# Patient Record
Sex: Female | Born: 1965 | Race: Black or African American | Hispanic: No | Marital: Single | Smoking: Never smoker
Health system: Southern US, Community
[De-identification: ages and names within clinical notes are randomized; demographics above are authoritative.]

## PROBLEM LIST (undated history)

## (undated) HISTORY — PX: KNEE SURGERY: SHX244

## (undated) HISTORY — PX: GALLBLADDER SURGERY: SHX652

## (undated) HISTORY — PX: TUBAL LIGATION: SHX77

---

## 2001-04-25 ENCOUNTER — Ambulatory Visit (HOSPITAL_COMMUNITY): Admission: RE | Admit: 2001-04-25 | Discharge: 2001-04-25 | Payer: Self-pay | Admitting: Chiropractic Medicine

## 2001-04-25 ENCOUNTER — Encounter: Payer: Self-pay | Admitting: Chiropractic Medicine

## 2001-11-14 ENCOUNTER — Emergency Department (HOSPITAL_COMMUNITY): Admission: EM | Admit: 2001-11-14 | Discharge: 2001-11-14 | Payer: Self-pay | Admitting: *Deleted

## 2001-12-19 ENCOUNTER — Emergency Department (HOSPITAL_COMMUNITY): Admission: EM | Admit: 2001-12-19 | Discharge: 2001-12-19 | Payer: Self-pay | Admitting: Emergency Medicine

## 2002-03-02 ENCOUNTER — Encounter: Admission: RE | Admit: 2002-03-02 | Discharge: 2002-05-31 | Payer: Self-pay | Admitting: Orthopedic Surgery

## 2002-04-01 ENCOUNTER — Encounter: Payer: Self-pay | Admitting: Orthopedic Surgery

## 2002-04-01 ENCOUNTER — Ambulatory Visit (HOSPITAL_BASED_OUTPATIENT_CLINIC_OR_DEPARTMENT_OTHER): Admission: RE | Admit: 2002-04-01 | Discharge: 2002-04-01 | Payer: Self-pay | Admitting: Orthopedic Surgery

## 2002-08-25 ENCOUNTER — Encounter: Payer: Self-pay | Admitting: Emergency Medicine

## 2002-08-25 ENCOUNTER — Emergency Department (HOSPITAL_COMMUNITY): Admission: EM | Admit: 2002-08-25 | Discharge: 2002-08-25 | Payer: Self-pay | Admitting: Emergency Medicine

## 2003-08-01 ENCOUNTER — Emergency Department (HOSPITAL_COMMUNITY): Admission: EM | Admit: 2003-08-01 | Discharge: 2003-08-01 | Payer: Self-pay | Admitting: Emergency Medicine

## 2003-11-16 ENCOUNTER — Emergency Department (HOSPITAL_COMMUNITY): Admission: EM | Admit: 2003-11-16 | Discharge: 2003-11-16 | Payer: Self-pay | Admitting: Emergency Medicine

## 2004-04-13 ENCOUNTER — Emergency Department (HOSPITAL_COMMUNITY): Admission: EM | Admit: 2004-04-13 | Discharge: 2004-04-13 | Payer: Self-pay | Admitting: Family Medicine

## 2005-05-15 ENCOUNTER — Emergency Department: Payer: Self-pay | Admitting: Emergency Medicine

## 2006-01-22 ENCOUNTER — Emergency Department: Payer: Self-pay | Admitting: Unknown Physician Specialty

## 2007-08-08 ENCOUNTER — Emergency Department: Payer: Self-pay | Admitting: Emergency Medicine

## 2008-07-09 ENCOUNTER — Emergency Department: Payer: Self-pay

## 2010-10-22 ENCOUNTER — Emergency Department (HOSPITAL_COMMUNITY): Payer: Self-pay

## 2010-10-22 ENCOUNTER — Emergency Department (HOSPITAL_COMMUNITY)
Admission: EM | Admit: 2010-10-22 | Discharge: 2010-10-23 | Disposition: A | Discharge status: Home / Self Care | Payer: Self-pay | Attending: Emergency Medicine | Admitting: Emergency Medicine

## 2010-10-22 DIAGNOSIS — R209 Unspecified disturbances of skin sensation: Secondary | ICD-10-CM | POA: Insufficient documentation

## 2010-10-22 DIAGNOSIS — S46909A Unspecified injury of unspecified muscle, fascia and tendon at shoulder and upper arm level, unspecified arm, initial encounter: Secondary | ICD-10-CM | POA: Insufficient documentation

## 2010-10-22 DIAGNOSIS — S4980XA Other specified injuries of shoulder and upper arm, unspecified arm, initial encounter: Secondary | ICD-10-CM | POA: Insufficient documentation

## 2010-10-22 DIAGNOSIS — R112 Nausea with vomiting, unspecified: Secondary | ICD-10-CM | POA: Insufficient documentation

## 2010-10-22 DIAGNOSIS — IMO0002 Reserved for concepts with insufficient information to code with codable children: Secondary | ICD-10-CM | POA: Insufficient documentation

## 2010-10-22 DIAGNOSIS — R0789 Other chest pain: Secondary | ICD-10-CM | POA: Insufficient documentation

## 2010-10-22 DIAGNOSIS — M25519 Pain in unspecified shoulder: Secondary | ICD-10-CM | POA: Insufficient documentation

## 2010-10-22 DIAGNOSIS — W108XXA Fall (on) (from) other stairs and steps, initial encounter: Secondary | ICD-10-CM | POA: Insufficient documentation

## 2011-09-02 ENCOUNTER — Emergency Department (HOSPITAL_COMMUNITY)
Admission: EM | Admit: 2011-09-02 | Discharge: 2011-09-03 | Disposition: A | Discharge status: Home / Self Care | Payer: Self-pay | Attending: Emergency Medicine | Admitting: Emergency Medicine

## 2011-09-02 ENCOUNTER — Encounter (HOSPITAL_COMMUNITY): Payer: Self-pay | Admitting: *Deleted

## 2011-09-02 DIAGNOSIS — R0789 Other chest pain: Secondary | ICD-10-CM

## 2011-09-02 DIAGNOSIS — Z9089 Acquired absence of other organs: Secondary | ICD-10-CM | POA: Insufficient documentation

## 2011-09-02 DIAGNOSIS — R071 Chest pain on breathing: Secondary | ICD-10-CM | POA: Insufficient documentation

## 2011-09-02 DIAGNOSIS — R079 Chest pain, unspecified: Secondary | ICD-10-CM | POA: Insufficient documentation

## 2011-09-02 MED ORDER — ASPIRIN 81 MG PO CHEW
324.0000 mg | CHEWABLE_TABLET | Freq: Once | ORAL | Status: AC
  Administered 2011-09-02: 324 mg via ORAL
  Filled 2011-09-02: qty 4

## 2011-09-02 NOTE — ED Notes (Signed)
IV team was called to attempt IV for Tina Acosta.

## 2011-09-02 NOTE — ED Provider Notes (Signed)
History     CSN: [id number]  Arrival date & time 09/02/11  2238   First MD Initiated Contact with Patient 09/02/11 2306      Chief Complaint  Patient presents with  . Chest Pain    (Consider location/radiation/quality/duration/timing/severity/associated sxs/prior treatment) HPI Patient complaining of right side chest pain for 7 days.  Pain comes and goes, it is sharp and radiates from right flank under right breast to parasternal area.  Pain does not increase except with movemnent of arms.  Pain has increased tonight.  She has associated dyspnea with pain with deep breathing.  There is no neck pain, back pain, fever, cough, nausea, vomiting, leg swelling, or history of dvt.  Patient does not smoke and is not on hormones. S/P cholecystectomy.  History reviewed. No pertinent past medical history.  History reviewed. No pertinent past surgical history.  No family history on file.  History  Substance Use Topics  . Smoking status: Never Smoker   . Smokeless tobacco: Not on file  . Alcohol Use: No    OB History    Grav Para Term Preterm Abortions TAB SAB Ect Mult Living                  Review of Systems  All other systems reviewed and are negative.    Allergies  Review of patient's allergies indicates no known allergies.  Home Medications   Current Outpatient Rx  Name Route Sig Dispense Refill  . ASPIRIN EC 81 MG PO TBEC Oral Take 81 mg by mouth once.    . IBUPROFEN 200 MG PO TABS Oral Take 400 mg by mouth every 6 (six) hours as needed. For pain      BP 113/68  Pulse 76  Temp(Src) 98.2 F (36.8 C) (Oral)  Resp 18  SpO2 98%  LMP 08/02/2011  Physical Exam  Nursing note and vitals reviewed. Constitutional: She appears well-developed and well-nourished.       Morbidly obese  HENT:  Head: Normocephalic.  Eyes: Conjunctivae are normal. Pupils are equal, round, and reactive to light.  Neck: Normal range of motion. Neck supple.  Pulmonary/Chest: Effort normal.          Tenderness to palpation under right breast    ED Course  Procedures (including critical care time)   Labs Reviewed  CBC  DIFFERENTIAL  COMPREHENSIVE METABOLIC PANEL  LIPASE, BLOOD  TROPONIN I  URINALYSIS, ROUTINE W REFLEX MICROSCOPIC  PREGNANCY, URINE  CK TOTAL AND CKMB   No results found.   No diagnosis found.   Date: 09/03/2011  Rate: 83  Rhythm: normal sinus rhythm  QRS Axis: normal  Intervals: normal  ST/T Wave abnormalities: nonspecific t wave changes v3  Conduction Disutrbances: none  Narrative Interpretation: unremarkable     MDM  Pain consistent with chest wall pain. Initial CT angiography and was nondiagnostic due to poor contrast. She has had difficult IV access. Nursing was unable to obtain anything larger than a 22 which is in her right upper arm. 20 in her left antecubital was blown during the injection of contrast. Secondary to the above the IV access was obtained by me. She had a 20-gauge catheter placed in her left forearm under sterile conditions. She good blood draw and flushing this was subsequently used obtained her CT angiogram.        Hilario Quarry, MD 09/03/11 [phone]

## 2011-09-02 NOTE — ED Notes (Signed)
The pt has had rt upper quadrant or rt lower chest pain since Monday intermittently no nv.  Her gb was removed  When she was 16.  She has taken tums without relief

## 2011-09-03 ENCOUNTER — Emergency Department (HOSPITAL_COMMUNITY): Payer: Self-pay

## 2011-09-03 ENCOUNTER — Encounter (HOSPITAL_COMMUNITY): Payer: Self-pay | Admitting: Radiology

## 2011-09-03 LAB — DIFFERENTIAL
Basophils Absolute: 0.1 10*3/uL (ref 0.0–0.1)
Basophils Relative: 1 % (ref 0–1)
Eosinophils Absolute: 0.3 10*3/uL (ref 0.0–0.7)
Eosinophils Relative: 4 % (ref 0–5)
Lymphocytes Relative: 43 % (ref 12–46)
Lymphs Abs: 3.7 10*3/uL (ref 0.7–4.0)
Monocytes Absolute: 0.7 10*3/uL (ref 0.1–1.0)
Monocytes Relative: 8 % (ref 3–12)
Neutro Abs: 3.8 10*3/uL (ref 1.7–7.7)
Neutrophils Relative %: 44 % (ref 43–77)

## 2011-09-03 LAB — COMPREHENSIVE METABOLIC PANEL
ALT: 16 U/L (ref 0–35)
AST: 21 U/L (ref 0–37)
Albumin: 3.8 g/dL (ref 3.5–5.2)
Alkaline Phosphatase: 98 U/L (ref 39–117)
BUN: 21 mg/dL (ref 6–23)
CO2: 23 mEq/L (ref 19–32)
Calcium: 9.1 mg/dL (ref 8.4–10.5)
Chloride: 99 mEq/L (ref 96–112)
Creatinine, Ser: 0.66 mg/dL (ref 0.50–1.10)
GFR calc Af Amer: >90 mL/min (ref >90)
GFR calc non Af Amer: >90 mL/min (ref >90)
Glucose, Bld: 104 mg/dL — ABNORMAL HIGH (ref 70–99)
Potassium: 4.2 mEq/L (ref 3.5–5.1)
Sodium: 135 mEq/L (ref 135–145)
Total Bilirubin: 0.4 mg/dL (ref 0.3–1.2)
Total Protein: 8 g/dL (ref 6.0–8.3)

## 2011-09-03 LAB — URINALYSIS, ROUTINE W REFLEX MICROSCOPIC
Glucose, UA: NEGATIVE mg/dL
Ketones, ur: NEGATIVE mg/dL
Leukocytes, UA: NEGATIVE
Nitrite: NEGATIVE
Specific Gravity, Urine: 1.034 — ABNORMAL HIGH (ref 1.005–1.030)
pH: 6 (ref 5.0–8.0)

## 2011-09-03 LAB — CBC
HCT: 35 % — ABNORMAL LOW (ref 36.0–46.0)
MCHC: 32.6 g/dL (ref 30.0–36.0)
MCV: 82.4 fL (ref 78.0–100.0)
Platelets: 282 10*3/uL (ref 150–400)
RDW: 15 % (ref 11.5–15.5)
WBC: 8.6 10*3/uL (ref 4.0–10.5)

## 2011-09-03 LAB — CK TOTAL AND CKMB (NOT AT ARMC)
CK, MB: 1.3 ng/mL (ref 0.3–4.0)
Relative Index: INVALID (ref 0.0–2.5)
Total CK: 78 U/L (ref 7–177)

## 2011-09-03 LAB — PREGNANCY, URINE: Preg Test, Ur: NEGATIVE

## 2011-09-03 LAB — TROPONIN I: Troponin I: <0.3 ng/mL (ref <0.30)

## 2011-09-03 MED ORDER — OXYCODONE-ACETAMINOPHEN 5-325 MG PO TABS
2.0000 | ORAL_TABLET | Freq: Once | ORAL | Status: AC
  Administered 2011-09-03: 2 via ORAL
  Filled 2011-09-03: qty 2

## 2011-09-03 MED ORDER — KETOROLAC TROMETHAMINE 30 MG/ML IJ SOLN
30.0000 mg | Freq: Once | INTRAMUSCULAR | Status: AC
  Administered 2011-09-03: 30 mg via INTRAVENOUS
  Filled 2011-09-03: qty 1

## 2011-09-03 MED ORDER — OXYCODONE-ACETAMINOPHEN 5-325 MG PO TABS: 1.0000 | ORAL_TABLET | ORAL | Status: AC | PRN

## 2011-09-03 MED ORDER — IOHEXOL 350 MG/ML SOLN
75.0000 mL | Freq: Once | INTRAVENOUS | Status: AC | PRN
  Administered 2011-09-03: 75 mL via INTRAVENOUS

## 2011-09-03 MED ORDER — IOHEXOL 350 MG/ML SOLN
100.0000 mL | Freq: Once | INTRAVENOUS | Status: AC | PRN
  Administered 2011-09-03: 100 mL via INTRAVENOUS

## 2011-09-03 NOTE — ED Notes (Signed)
PT ambulated with a steady gait; VSS; A&Ox3; no signs of distress; respirations even and unlabored' skin warm and dry. No questions at this time.  

## 2011-09-03 NOTE — Discharge Instructions (Signed)
Chest Wall Pain Chest wall pain is pain in or around the bones and muscles of your chest. It may take up to 6 weeks to get better. It may take longer if you must stay physically active in your work and activities.  CAUSES  Chest wall pain may happen on its own. However, it may be caused by:  A viral illness like the flu.   Injury.   Coughing.   Exercise.   Arthritis.   Fibromyalgia.   Shingles.  HOME CARE INSTRUCTIONS   Avoid overtiring physical activity. Try not to strain or perform activities that cause pain. This includes any activities using your chest or your abdominal and side muscles, especially if heavy weights are used.   Put ice on the sore area.   Put ice in a plastic bag.   Place a towel between your skin and the bag.   Leave the ice on for 15 to 20 minutes per hour while awake for the first 2 days.   Only take over-the-counter or prescription medicines for pain, discomfort, or fever as directed by your caregiver.  SEEK IMMEDIATE MEDICAL CARE IF:   Your pain increases, or you are very uncomfortable.   You have a fever.   Your chest pain becomes worse.   You have new, unexplained symptoms.   You have nausea or vomiting.   You feel sweaty or lightheaded.   You have a cough with phlegm (sputum), or you cough up blood.  MAKE SURE YOU:   Understand these instructions.   Will watch your condition.   Will get help right away if you are not doing well or get worse.  Document Released: 03/12/2005 Document Revised: 03/01/2011 Document Reviewed: 11/06/2010 [employer] Patient Information 2012 [employer]. 

## 2011-09-03 NOTE — ED Notes (Signed)
Patient transported to CT 

## 2011-09-03 NOTE — ED Notes (Signed)
IV team at bedside 

## 2012-06-19 ENCOUNTER — Emergency Department (HOSPITAL_COMMUNITY)
Admission: EM | Admit: 2012-06-19 | Discharge: 2012-06-20 | Disposition: A | Discharge status: Home / Self Care | Payer: Self-pay | Attending: Emergency Medicine | Admitting: Emergency Medicine

## 2012-06-19 ENCOUNTER — Encounter (HOSPITAL_COMMUNITY): Payer: Self-pay | Admitting: *Deleted

## 2012-06-19 DIAGNOSIS — Z3202 Encounter for pregnancy test, result negative: Secondary | ICD-10-CM | POA: Insufficient documentation

## 2012-06-19 DIAGNOSIS — R202 Paresthesia of skin: Secondary | ICD-10-CM

## 2012-06-19 DIAGNOSIS — M549 Dorsalgia, unspecified: Secondary | ICD-10-CM

## 2012-06-19 DIAGNOSIS — IMO0002 Reserved for concepts with insufficient information to code with codable children: Secondary | ICD-10-CM | POA: Insufficient documentation

## 2012-06-19 DIAGNOSIS — Z7982 Long term (current) use of aspirin: Secondary | ICD-10-CM | POA: Insufficient documentation

## 2012-06-19 DIAGNOSIS — Y9389 Activity, other specified: Secondary | ICD-10-CM | POA: Insufficient documentation

## 2012-06-19 DIAGNOSIS — X500XXA Overexertion from strenuous movement or load, initial encounter: Secondary | ICD-10-CM | POA: Insufficient documentation

## 2012-06-19 DIAGNOSIS — R209 Unspecified disturbances of skin sensation: Secondary | ICD-10-CM | POA: Insufficient documentation

## 2012-06-19 DIAGNOSIS — Y929 Unspecified place or not applicable: Secondary | ICD-10-CM | POA: Insufficient documentation

## 2012-06-19 NOTE — ED Notes (Signed)
Pt c/o lower back x 3 days, c/o urinary frequency.  Also c/o arm pain when waking in the morning.  C/o weight gain over past month.

## 2012-06-20 ENCOUNTER — Encounter (HOSPITAL_COMMUNITY): Payer: Self-pay | Admitting: Radiology

## 2012-06-20 ENCOUNTER — Emergency Department (HOSPITAL_COMMUNITY): Payer: Self-pay

## 2012-06-20 LAB — URINALYSIS, ROUTINE W REFLEX MICROSCOPIC
Bilirubin Urine: NEGATIVE
Hgb urine dipstick: NEGATIVE
Ketones, ur: NEGATIVE mg/dL
Nitrite: NEGATIVE
Specific Gravity, Urine: 1.026 (ref 1.005–1.030)
Urobilinogen, UA: 0.2 mg/dL (ref 0.0–1.0)

## 2012-06-20 MED ORDER — IBUPROFEN 800 MG PO TABS: 800.0000 mg | ORAL_TABLET | Freq: Three times a day (TID) | ORAL | Status: DC

## 2012-06-20 MED ORDER — HYDROCODONE-ACETAMINOPHEN 5-325 MG PO TABS: 1.0000 | ORAL_TABLET | ORAL | Status: DC | PRN

## 2012-06-20 NOTE — ED Notes (Signed)
PA at bedside.

## 2012-06-20 NOTE — ED Provider Notes (Signed)
History     CSN: [id number]  Arrival date & time 06/19/12  2254   First MD Initiated Contact with Patient 06/19/12 2358      Chief Complaint  Patient presents with  . Back Pain    (Consider location/radiation/quality/duration/timing/severity/associated sxs/prior treatment) HPI History provided by pt.   Pt c/o 2-3 days constant, mid-line low back pain, with radiation up back to neck.  Aggravated by movement.  Simultaneous onset sharp pain flexor surface right elbow that is aggravated by ROM and associated w/ intermittent paresthesias and limited active ROM.  It feels as though her arm is asleep.  Occurs when she wakes in am and when she attempts to lift something.  Denies headache, dizziness, vision changes, dysarthria, dysphagia, weakness/paresthesias RUE and LEs as well as fever and bladder/bowel dysfunction.  Denies trauma.   No prior h/o same.  No PMH.  History reviewed. No pertinent past medical history.  Past Surgical History  Procedure Laterality Date  . Gallbladder surgery    . Tubal ligation      History reviewed. No pertinent family history.  History  Substance Use Topics  . Smoking status: Never Smoker   . Smokeless tobacco: Not on file  . Alcohol Use: No    OB History   Grav Para Term Preterm Abortions TAB SAB Ect Mult Living                  Review of Systems  All other systems reviewed and are negative.    Allergies  Review of patient's allergies indicates no known allergies.  Home Medications   Current Outpatient Rx  Name  Route  Sig  Dispense  Refill  . aspirin EC 81 MG tablet   Oral   Take 81 mg by mouth once.         Marland Kitchen ibuprofen (ADVIL,MOTRIN) 200 MG tablet   Oral   Take 400 mg by mouth every 6 (six) hours as needed. For pain           BP 127/76  Pulse 92  Temp(Src) 98 F (36.7 C) (Oral)  Resp 22  Ht 5\' 7"  (1.702 m)  Wt 347 lb (157.398 kg)  BMI 54.34 kg/m2  SpO2 100%  LMP 06/16/2012  Physical Exam  Nursing note and vitals  reviewed. Constitutional: She is oriented to person, place, and time. She appears well-developed and well-nourished. No distress.  Morbidly obese  HENT:  Head: Normocephalic and atraumatic.  Eyes:  Normal appearance  Neck: Normal range of motion.  Cardiovascular: Normal rate, regular rhythm and intact distal pulses.   Pulmonary/Chest: Effort normal and breath sounds normal.  Musculoskeletal: Normal range of motion.  Mid-line lower cervical as well as lumbar spinal ttp.  Tenderness left antecubital fossa.  No overlying skin changes or edema.  Pain w/ passive flexion.    Neurological: She is alert and oriented to person, place, and time. No sensory deficit. Coordination normal.  CN 3-12 intact.  No nystagmus. 5/5 and equal lower extremity strength.  Decreased left elbow flexion/extension strength compared to right.  No past pointing.     Skin: Skin is warm and dry. No rash noted.  Psychiatric: She has a normal mood and affect. Her behavior is normal.    ED Course  Procedures (including critical care time)  Labs Reviewed  URINALYSIS, ROUTINE W REFLEX MICROSCOPIC - Abnormal; Notable for the following:    APPearance HAZY (*)    All other components within normal limits  POCT PREGNANCY, URINE  Dg Lumbar Spine Complete  06/20/2012  *RADIOLOGY REPORT*  Clinical Data: No injury.  Progressive back pain.  LUMBAR SPINE - COMPLETE 4+ VIEW  Comparison: None.  Findings: Mild dextroconvex curvature of the lumbar spine.  Lower lumbar facet arthrosis.  No pars defects.  L3-L4 predominant degenerative disc disease with loss of disc height and small marginal spurs.  There is no spondylolisthesis.  L4-L5 and L5-S1 facet arthrosis.  Vertebral body height is preserved.  Lumbosacral junction appears normal.  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  IMPRESSION: Mild lumbar spondylosis and facet arthrosis without acute osseous abnormality.  L3-L4 predominant degenerative disc disease.  Dextroconvex curve may be positional or secondary spasm.   Original Report Authenticated By: Andreas Newport, M.D.    Ct Head Wo Contrast  06/20/2012  *RADIOLOGY REPORT*  Clinical Data:  Parasthesias.  Intermittent paralysis of the left arm.  Sensory disturbance.  CT HEAD WITHOUT CONTRAST CT CERVICAL SPINE WITHOUT CONTRAST  Technique:  Multidetector CT imaging of the head and cervical spine was performed following the standard protocol without intravenous contrast.  Multiplanar CT image reconstructions of the cervical spine were also generated.  Comparison:   None  CT HEAD  Findings: No mass lesion, mass effect, midline shift, hydrocephalus, hemorrhage.  No territorial ischemia or acute infarction.  Paranasal sinuses appear within normal limits.  IMPRESSION: Negative CT head.  CT CERVICAL SPINE  Findings: Reversal of the normal cervical lordosis.  No cervical spine fracture dislocation.  The study is technically degraded by body habitus.  No bony central stenosis is identified. Craniocervical alignment is normal.  Odontoid intact.  Mandibular condyles located.  Occipital condyles appear normal.  Lung apices appear within normal limits.  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  Left cervical rib is rudimentary.  Short pedicles are present in the cervical spine.  There is foraminal encroachment at C2-C3 and C3-C4 potentially affecting the C3 and C4 nerves in this patient with left upper extremity symptoms.  IMPRESSION: No acute osseous abnormality.  Loss of normal cervical lordosis is probably positional.  Foraminal encroachment that could potentially affects the left C3 and C4 nerves.   Original Report Authenticated By: Andreas Newport, M.D.    Ct Cervical Spine Wo Contrast  06/20/2012  *RADIOLOGY REPORT*  Clinical Data:  Parasthesias.  Intermittent paralysis of the left arm.  Sensory disturbance.  CT HEAD  WITHOUT CONTRAST CT CERVICAL SPINE WITHOUT CONTRAST  Technique:  Multidetector CT imaging of the head and cervical spine was performed following the standard protocol without intravenous contrast.  Multiplanar CT image reconstructions of the cervical spine were also generated.  Comparison:   None  CT HEAD  Findings: No mass lesion, mass effect, midline shift, hydrocephalus, hemorrhage.  No territorial ischemia or acute infarction.  Paranasal sinuses appear within normal limits.  IMPRESSION: Negative CT head.  CT CERVICAL SPINE  Findings: Reversal of the normal cervical lordosis.  No cervical spine fracture dislocation.  The study is technically degraded by body habitus.  No bony central stenosis is identified. Craniocervical alignment is normal.  Odontoid intact.  Mandibular condyles located.  Occipital condyles appear normal.  Lung apices appear within normal limits.  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  Left cervical rib is rudimentary.  Short pedicles are present in the cervical spine.  There is foraminal encroachment at C2-C3 and C3-C4 potentially affecting the C3 and C4 nerves in this patient with left upper extremity symptoms.  IMPRESSION: No acute osseous abnormality.  Loss of normal cervical lordosis is probably positional.  Foraminal encroachment that could potentially affects the left C3 and C4 nerves.   Original Report Authenticated By: Andreas Newport, M.D.      1. Back pain   2. Paresthesias       MDM  Morbidly obese but otherwise healthy 47yo F presents w/ non-traumatic low back pain x 2-3 days and simultaneous onset pain, paresthesias and weakness of LUE that occurs upon waking, as well as when she attempts to lift something.  On exam, afebrile, NAD, tenderness lower cervical as well as lumbar spine, no focal neuro deficits w/ exception of decreased left elbow flexion/extension  strength, tenderness L antecubital fossa and painful passive ROM but no skin changes.  CT head and cervical spine as well as xray lumbar spine obtained at Dr. Jacolyn Reedy recommendation and are unremarkable w/ exception of mild lumbar spondylosis/DJD.  Results discussed w/ pt.  Will treat symptomatically for peripheral neuropathy or nerve entrapment of LUE as well as low back pain with sling (provided by ortho tech), vicodin, ibuprofen and ice and referral to ortho for persistent sx.  Return precautions discussed. 4:32 AM         Otilio Miu, PA-C 06/20/12 [phone]

## 2012-08-31 NOTE — ED Provider Notes (Signed)
Medical screening examination/treatment/procedure(s) were performed by non-physician practitioner and as supervising physician Dr. Verl Bangs (not available to sign chart himself) was immediately available for consultation/collaboration. Wayland Salinas, MD Medical Director  Hurman Horn, MD 08/31/12 [phone]

## 2012-12-12 ENCOUNTER — Telehealth: Payer: Self-pay

## 2012-12-12 ENCOUNTER — Ambulatory Visit: Payer: No Typology Code available for payment source | Attending: Internal Medicine | Admitting: Internal Medicine

## 2012-12-12 VITALS — BP 151/92 | HR 75 | Temp 97.9°F | Resp 15 | Wt 341.0 lb

## 2012-12-12 DIAGNOSIS — Z23 Encounter for immunization: Secondary | ICD-10-CM

## 2012-12-12 DIAGNOSIS — K0889 Other specified disorders of teeth and supporting structures: Secondary | ICD-10-CM

## 2012-12-12 DIAGNOSIS — L708 Other acne: Secondary | ICD-10-CM

## 2012-12-12 DIAGNOSIS — Z0000 Encounter for general adult medical examination without abnormal findings: Secondary | ICD-10-CM

## 2012-12-12 DIAGNOSIS — K089 Disorder of teeth and supporting structures, unspecified: Secondary | ICD-10-CM | POA: Insufficient documentation

## 2012-12-12 DIAGNOSIS — R635 Abnormal weight gain: Secondary | ICD-10-CM | POA: Insufficient documentation

## 2012-12-12 DIAGNOSIS — L709 Acne, unspecified: Secondary | ICD-10-CM | POA: Insufficient documentation

## 2012-12-12 DIAGNOSIS — R030 Elevated blood-pressure reading, without diagnosis of hypertension: Secondary | ICD-10-CM

## 2012-12-12 LAB — COMPREHENSIVE METABOLIC PANEL
BUN: 17 mg/dL (ref 6–23)
CO2: 28 mEq/L (ref 19–32)
Calcium: 9.2 mg/dL (ref 8.4–10.5)
Chloride: 103 mEq/L (ref 96–112)
Creat: 0.69 mg/dL (ref 0.50–1.10)
Total Bilirubin: 0.8 mg/dL (ref 0.3–1.2)

## 2012-12-12 LAB — CBC WITH DIFFERENTIAL/PLATELET
Eosinophils Absolute: 0.2 10*3/uL (ref 0.0–0.7)
Eosinophils Relative: 3 % (ref 0–5)
HCT: 36.3 % (ref 36.0–46.0)
Lymphocytes Relative: 37 % (ref 12–46)
Lymphs Abs: 2.7 10*3/uL (ref 0.7–4.0)
MCH: 27.3 pg (ref 26.0–34.0)
MCV: 80.7 fL (ref 78.0–100.0)
Monocytes Absolute: 0.8 10*3/uL (ref 0.1–1.0)
RBC: 4.5 MIL/uL (ref 3.87–5.11)
RDW: 14.7 % (ref 11.5–15.5)
WBC: 7.3 10*3/uL (ref 4.0–10.5)

## 2012-12-12 LAB — LIPID PANEL
Cholesterol: 251 mg/dL — ABNORMAL HIGH (ref 0–200)
HDL: 50 mg/dL (ref >39)
Total CHOL/HDL Ratio: 5 Ratio
Triglycerides: 139 mg/dL (ref <150)
VLDL: 28 mg/dL (ref 0–40)

## 2012-12-12 LAB — TSH: TSH: 2.251 u[IU]/mL (ref 0.350–4.500)

## 2012-12-12 NOTE — Progress Notes (Signed)
Patient ID: [name], female   DOB: 10-29-1965, 47 y.o.   MRN: [id number] Patient Demographics  Tina Acosta, is a 47 y.o. female  WUJ:[id number]  NFA:[id number]  DOB - 01-01-1966  Chief Complaint  Patient presents with  . Weight Gain  . Knee Pain        Subjective:   Tina Acosta today is here to establish primary care. The patient came in with several complaints and requests, has not been following any PCP. 1. states has gained more than 100lbs in 1 year, does not know why, has not been eating excessively. 2. wants a dental referral as left first premolar, lower came out 3. wants dermatology referral for acne 4. wants Pap smear, mammogram and whole body physical Patient was advised that the above complaints/requests will be addressed this visit Patient has No headache, No chest pain, No abdominal pain - No Nausea, No new weakness tingling or numbness, No Cough - SOB.   Objective:    Filed Vitals:   12/12/12 1001  BP: 151/92  Pulse: 75  Temp: 97.9 F (36.6 C)  Resp: 15  Weight: 341 lb (154.677 kg)  SpO2: 100%     ALLERGIES:  No Known Allergies  PAST MEDICAL HISTORY: History reviewed. No pertinent past medical history.  PAST SURGICAL HISTORY: Past Surgical History  Procedure Laterality Date  . Gallbladder surgery    . Tubal ligation      FAMILY HISTORY: History reviewed. No pertinent family history.  MEDICATIONS AT HOME: Prior to Admission medications   Medication Sig Start Date End Date Taking? Authorizing Provider  aspirin EC 81 MG tablet Take 81 mg by mouth once.    Historical Provider, MD  HYDROcodone-acetaminophen (NORCO/VICODIN) 5-325 MG per tablet Take 1 tablet by mouth every 4 (four) hours as needed for pain. 06/20/12   Arie Sabina Schinlever, PA-C  ibuprofen (ADVIL,MOTRIN) 200 MG tablet Take 400 mg by mouth every 6 (six) hours as needed. For pain    Historical Provider, MD  ibuprofen (ADVIL,MOTRIN) 800 MG tablet Take 1 tablet (800 mg total)  by mouth 3 (three) times daily. 06/20/12   Arie Sabina Schinlever, PA-C    REVIEW OF SYSTEMS:  Constitutional:   No   Fevers, chills, fatigue.  HEENT:    No headaches, Sore throat,   Cardio-vascular: No chest pain,  Orthopnea, swelling in lower extremities, anasarca, palpitations  GI:  No abdominal pain, nausea, vomiting, diarrhea  Resp: No shortness of breath,  No coughing up of blood.No cough.No wheezing.  Skin:  no rash or lesions.  GU:  no dysuria, change in color of urine, no urgency or frequency.  No flank pain.  Musculoskeletal: No joint pain or swelling.  No decreased range of motion.  No back pain.  Psych: No change in mood or affect. No depression or anxiety.  No memory loss.   Exam  General appearance :Awake, alert, NAD, Speech Clear. HEENT: Atraumatic and Normocephalic, PERLA Neck: supple, no JVD. No cervical lymphadenopathy.  Chest: clear to auscultation bilaterally, no wheezing, rales or rhonchi CVS: S1 S2 regular, no murmurs.  Abdomen: Obese soft, NBS, NT, ND, no gaurding, rigidity or rebound. Extremities: No cyanosis, clubbing, B/L Lower Ext shows no edema,  Neurology: Awake alert, and oriented X 3, CN II-XII intact, Non focal Skin:No Rash or lesions Wounds: N/A    Data Review   Basic Metabolic Panel: No results found for this basename: NA, K, CL, CO2, GLUCOSE, BUN, CREATININE, CALCIUM, MG, PHOS,  in the last  168 hours Liver Function Tests: No results found for this basename: AST, ALT, ALKPHOS, BILITOT, PROT, ALBUMIN,  in the last 168 hours  CBC: No results found for this basename: WBC, NEUTROABS, HGB, HCT, MCV, PLT,  in the last 168 hours ------------------------------------------------------------------------------------------------------------------ No results found for this basename: HGBA1C,  in the last 72 hours ------------------------------------------------------------------------------------------------------------------ No results  found for this basename: CHOL, HDL, LDLCALC, TRIG, CHOLHDL, LDLDIRECT,  in the last 72 hours ------------------------------------------------------------------------------------------------------------------ No results found for this basename: TSH, T4TOTAL, FREET3, T3FREE, THYROIDAB,  in the last 72 hours ------------------------------------------------------------------------------------------------------------------ No results found for this basename: VITAMINB12, FOLATE, FERRITIN, TIBC, IRON, RETICCTPCT,  in the last 72 hours  Coagulation profile  No results found for this basename: INR, PROTIME,  in the last 168 hours    Assessment & Plan   Active Problems: Weight gain: Patient states gained more than 100 pounds in 1 year  -Will check TSH, UA, CMET, CBC, does not appear to be pitting edema, no symptoms of acute heart failure.  Patient states that she does feel somewhat short of breath after walking a block but also her knees started hurting.  - Advised patient for diet and weight control     odontalgia  - Dental referral sent  Acne - Dermatology referral sent, advised patient to use cleansers and not apply thick creams  Elevated BP: Patient states no history of hypertension, BP currently 151/92 - Will recheck next time, if she does have another reading of elevated BP, will need antihypertensive   health screening  - flu shot today  - Ambulatory referral to OB/GYN for Pap smear  - Mammogram ordered   Recommendations: followup on labs   Follow-up in  3 weeks    Nthony Lefferts M.D. 12/12/2012, 10:36 AM

## 2012-12-12 NOTE — Progress Notes (Signed)
Patient here for knee pain Has been gaining weight but does not eat much Has skin issue on her face Would like referral to eye dr Referral to dentist and dermatology Would like to have a mamogram as well

## 2012-12-13 LAB — URINALYSIS
Bilirubin Urine: NEGATIVE
Glucose, UA: NEGATIVE mg/dL
Ketones, ur: NEGATIVE mg/dL
Protein, ur: NEGATIVE mg/dL
Urobilinogen, UA: 0.2 mg/dL (ref 0.0–1.0)

## 2012-12-22 ENCOUNTER — Ambulatory Visit: Payer: No Typology Code available for payment source

## 2012-12-25 ENCOUNTER — Emergency Department (HOSPITAL_COMMUNITY)
Admission: EM | Admit: 2012-12-25 | Discharge: 2012-12-25 | Disposition: A | Discharge status: Home / Self Care | Payer: No Typology Code available for payment source | Attending: Emergency Medicine | Admitting: Emergency Medicine

## 2012-12-25 ENCOUNTER — Emergency Department (HOSPITAL_COMMUNITY): Payer: No Typology Code available for payment source

## 2012-12-25 ENCOUNTER — Encounter (HOSPITAL_COMMUNITY): Payer: Self-pay | Admitting: Vascular Surgery

## 2012-12-25 DIAGNOSIS — K5900 Constipation, unspecified: Secondary | ICD-10-CM | POA: Insufficient documentation

## 2012-12-25 DIAGNOSIS — R1110 Vomiting, unspecified: Secondary | ICD-10-CM

## 2012-12-25 DIAGNOSIS — R112 Nausea with vomiting, unspecified: Secondary | ICD-10-CM | POA: Insufficient documentation

## 2012-12-25 DIAGNOSIS — Z79899 Other long term (current) drug therapy: Secondary | ICD-10-CM | POA: Insufficient documentation

## 2012-12-25 MED ORDER — BISACODYL 5 MG PO TBEC: 5.0000 mg | DELAYED_RELEASE_TABLET | Freq: Two times a day (BID) | ORAL | Status: DC

## 2012-12-25 MED ORDER — ONDANSETRON HCL 4 MG PO TABS: 4.0000 mg | ORAL_TABLET | Freq: Four times a day (QID) | ORAL | Status: DC

## 2012-12-25 MED ORDER — OXYCODONE-ACETAMINOPHEN 5-325 MG PO TABS
1.0000 | ORAL_TABLET | Freq: Once | ORAL | Status: AC
  Administered 2012-12-25: 1 via ORAL
  Filled 2012-12-25: qty 1

## 2012-12-25 MED ORDER — PEG 3350-KCL-NABCB-NACL-NASULF 236 G PO SOLR: 2.0000 L | Freq: Once | ORAL | Status: DC

## 2012-12-25 MED ORDER — BISACODYL 10 MG/30ML RE ENEM: 10.0000 mg | ENEMA | Freq: Once | RECTAL | Status: DC

## 2012-12-25 NOTE — Discharge Planning (Signed)
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 Patient was asked if she tried to get an appointment with her PCP before coming to the ED, patient states she did not try. Patient was educated about her orange card and was informed to always attempt to get an appointment at PCP for non-urgent matters. Patient states she does have an appointment with PCP on 01/08/13.

## 2012-12-25 NOTE — ED Notes (Signed)
Pt reports to the ED for eval of constipation. Pt reports that she has not been able to have a BM x 2 weeks. Pt reports that she feels a large amount of stool in her rectum but is unable to get it out. Pt reports that anytime she drinks or eats anything she either vomits or has green water come from her rectum. Pt denies any blood or coffee ground emesis. Pt also denies any fevers, chills, vaginal d/c, or bleeding. Pt has had hx of open cholecystectomy. Pt reports she has tried enemas and other OTC medications with no relief. Pt A&O x4. Abdomen tender to palpation and taut.

## 2012-12-25 NOTE — ED Provider Notes (Addendum)
I saw and evaluated the patient, reviewed the resident's note and I agree with the findings and plan.  This is a 47 year old female who presents with constipation. Patient normally has bowel movements every 2-3 days. She's been unable to have a bowel movement for the last 2 weeks.  Patient states that she's used enemas, mag citrate without relief. She's been able to tolerate by mouth but states that she has had some vomiting has been nonbilious, nonbloody. She denies any abdominal pain but does state she feels uncomfortable.  She denies any fevers, chills, vaginal complaint, dysuria.  Patient states that she strains to have a bowel movement and has some watery loose stool, now. She feels lightheaded if she strains too hard.  Patient is nontoxic-appearing on exam and vital signs are within normal limits. Abdomen is soft and nontender.  KUB is without evidence of fluid levels. Patient has had prior abdominal surgery which would put her at risk for small bowel structure; however, have low suspicion for this given her abdominal exam and negative KUB. Patient will be discharged home with GoLYTELY, stool softeners, and Dulcolax. She does have a primary care provider and was encouraged to follow-up for repeat abdominal exam in 1 day if continued symptoms.  Patient was encouraged to return if she has onset of bilious emesis.  After history, exam, and medical workup I feel the patient has been appropriately medically screened and is safe for discharge home. Pertinent diagnoses were discussed with the patient. Patient was given return precautions.   Shon Baton, MD 12/26/12 0114  Patient was able to by mouth challenge prior to discharge.  Shon Baton, MD 12/26/12 0157

## 2012-12-25 NOTE — ED Provider Notes (Signed)
CSN: [id number]     Arrival date & time 12/25/12  1528 History   First MD Initiated Contact with Patient 12/25/12 1529     Chief Complaint  Patient presents with  . Constipation   (Consider location/radiation/quality/duration/timing/severity/associated sxs/prior Treatment) Patient is a 47 y.o. female presenting with constipation.  Constipation Severity:  Moderate Time since last bowel movement:  2 weeks Timing:  Constant Progression:  Worsening Chronicity:  New Context: not narcotics   Stool description:  Watery and small Relieved by:  Nothing Worsened by:  Nothing tried Ineffective treatments:  Enemas, stool softeners and laxatives Associated symptoms: nausea and vomiting   Associated symptoms: no abdominal pain, no back pain, no diarrhea, no dysuria, no fever, no flatus and no urinary retention     History reviewed. No pertinent past medical history. Past Surgical History  Procedure Laterality Date  . Gallbladder surgery    . Tubal ligation     History reviewed. No pertinent family history. History  Substance Use Topics  . Smoking status: Never Smoker   . Smokeless tobacco: Not on file  . Alcohol Use: No   OB History   Grav Para Term Preterm Abortions TAB SAB Ect Mult Living                 Review of Systems  Constitutional: Negative for fever and chills.  HENT: Negative for congestion, sore throat and rhinorrhea.   Eyes: Negative for photophobia and visual disturbance.  Respiratory: Negative for cough and shortness of breath.   Cardiovascular: Negative for chest pain and leg swelling.  Gastrointestinal: Positive for nausea, vomiting and constipation. Negative for abdominal pain, diarrhea and flatus.  Endocrine: Negative for polyphagia and polyuria.  Genitourinary: Negative for dysuria, flank pain, vaginal bleeding, vaginal discharge and enuresis.  Musculoskeletal: Negative for back pain and gait problem.  Skin: Negative for color change and rash.  Neurological:  Negative for dizziness, syncope, light-headedness and numbness.  Hematological: Negative for adenopathy. Does not bruise/bleed easily.  All other systems reviewed and are negative.    Allergies  Review of patient's allergies indicates no known allergies.  Home Medications   Current Outpatient Rx  Name  Route  Sig  Dispense  Refill  . naproxen sodium (ANAPROX) 220 MG tablet   Oral   Take 660 mg by mouth 2 (two) times daily with a meal.         . bisacodyl (DULCOLAX) 5 MG EC tablet   Oral   Take 1 tablet (5 mg total) by mouth 2 (two) times daily.   14 tablet   0   . bisacodyl (FLEET) 10 MG/30ML ENEM   Rectal   Place 30 mLs (10 mg total) rectally once.   30 mL   0   . ondansetron (ZOFRAN) 4 MG tablet   Oral   Take 1 tablet (4 mg total) by mouth every 6 (six) hours.   6 tablet   0   . polyethylene glycol (GOLYTELY) 236 G solution   Oral   Take 2,000 mLs by mouth once.   4000 mL   0    BP 140/85  Pulse 74  Temp(Src) 98.2 F (36.8 C) (Oral)  Resp 18  SpO2 99%  LMP 11/24/2012 Physical Exam  Vitals reviewed. Constitutional: She is oriented to person, place, and time. She appears well-developed and well-nourished.  HENT:  Head: Normocephalic and atraumatic.  Right Ear: External ear normal.  Left Ear: External ear normal.  Eyes: Conjunctivae and EOM are normal.  Pupils are equal, round, and reactive to light.  Neck: Normal range of motion. Neck supple.  Cardiovascular: Normal rate, regular rhythm, normal heart sounds and intact distal pulses.   Pulmonary/Chest: Effort normal and breath sounds normal.  Abdominal: Soft. Bowel sounds are normal. There is tenderness in the right upper quadrant, right lower quadrant and left lower quadrant.  Genitourinary: Guaiac negative stool.  No stool in rectal vault  Musculoskeletal: Normal range of motion.  Neurological: She is alert and oriented to person, place, and time.  Skin: Skin is warm and dry.    ED Course   Procedures (including critical care time) Labs Review Labs Reviewed - No data to display Imaging Review Dg Abd 1 View  12/25/2012   CLINICAL DATA:  Vomiting and constipation  EXAM: ABDOMEN - 1 VIEW  COMPARISON:  June 20, 2012  FINDINGS: There is a fairly mild degree of stool in the colon. The bowel gas pattern is normal. No obstruction or free air is seen on this supine examination. There are stable calcifications in the pelvis consistent with phleboliths.  IMPRESSION: Unremarkable bowel gas pattern.   Electronically Signed   By: Bretta Bang   On: 12/25/2012 16:21    MDM   1. Constipation   2. Vomiting    47 y.o. female  with pertinent PMH of prior cholecystectomy presents with constipation x 2 weeks, as well as nausea and vomiting.  Physical exam as above, no stool in rectal vault.  XR demonstrated no SBO, mild stool burden.  No abd tenderness on repeat exam.  Discussed results with pt and with shared decision making decided to attempt golytely, stool softener, and enema, and have pt fu with PCP in 1-2 days for repeat abd exam.  Given strict return precautions, and agrees with plan and fu.  Doubt appendicitis, mesenteric ischemia, or other emergent etiology    Labs and imaging as above reviewed by myself and attending,Dr. Wilkie Aye, with whom case was discussed.   1. Constipation   2. Vomiting         Noel Gerold, MD 12/26/12 [phone]

## 2012-12-26 NOTE — ED Provider Notes (Addendum)
I saw and evaluated the patient, reviewed the resident's note and I agree with the findings and plan.  Shon Baton, MD 12/26/12 [id number]  Shon Baton, MD 12/26/12 [phone]

## 2013-01-08 ENCOUNTER — Ambulatory Visit: Payer: No Typology Code available for payment source | Attending: Internal Medicine

## 2013-01-08 DIAGNOSIS — Z0000 Encounter for general adult medical examination without abnormal findings: Secondary | ICD-10-CM

## 2013-01-08 DIAGNOSIS — R635 Abnormal weight gain: Secondary | ICD-10-CM

## 2013-01-08 NOTE — Progress Notes (Unsigned)
Pt is here for a f/u visit. Pt wants to review her lab results. Pt reports having unexplained acne, low energy and weight gain. Also she reports that her vision has become worse and even hurts when looking at something up close.

## 2013-01-08 NOTE — Progress Notes (Unsigned)
Patient ID: Tina Acosta, female   DOB: 1966/02/16, 47 y.o.   MRN: [id number] Chief complaint  follow up   HPI 47 year old female seen in the clinic recently to establish care with complaints of unexplained weight gain for past one year of almost 70 lbs here for follow up. Does report anxiety and emotional lability. Reports irregular menstrual cycle for past several weeks. Also has been troubled with facial acne for the same duration. Denies any change in her appetite. Denies any urinary symptoms. Does report frontal headache weight some blurred vision for the same time. Denies nausea, vomiting, chest pain, palpitation. Does complain of dyspnea on exertion. Does have episodes of constipation.  Vital signs in last 24 hours:  Filed Vitals:   01/08/13 1146  BP: 120/84  Pulse: 68  Temp: 98.1 F (36.7 C)  TempSrc: Oral  Resp: 16  Height: 5' 7.32" (1.71 m)  Weight: 337 lb (152.862 kg)  SpO2: 94%    Intake/Output from previous day:   Physical Exam:  General: morbidly obese female in no acute distress. HEENT: no pallor, no icterus, moist oral mucosa, no JVD, no thyroimegaly Heart: Normal  s1 &s2  Regular rate and rhythm, without murmurs, rubs, gallops. Lungs: Clear to auscultation bilaterally. Abdomen: Soft, nontender, nondistended, positive bowel sounds. Extremities: No clubbing cyanosis or edema with positive pedal pulses. Neuro: Alert, awake, oriented x3, nonfocal.   Lab Results:  Basic Metabolic Panel:    Component Value Date/Time   NA 138 12/12/2012 1036   K 4.3 12/12/2012 1036   CL 103 12/12/2012 1036   CO2 28 12/12/2012 1036   BUN 17 12/12/2012 1036   CREATININE 0.69 12/12/2012 1036   CREATININE 0.66 09/02/2011 2303   GLUCOSE 97 12/12/2012 1036   CALCIUM 9.2 12/12/2012 1036   CBC:    Component Value Date/Time   WBC 7.3 12/12/2012 1036   HGB 12.3 12/12/2012 1036   HCT 36.3 12/12/2012 1036   PLT 357 12/12/2012 1036   MCV 80.7 12/12/2012 1036   NEUTROABS 3.5 12/12/2012 1036   LYMPHSABS 2.7 12/12/2012 1036   MONOABS 0.8 12/12/2012 1036   EOSABS 0.2 12/12/2012 1036   BASOSABS 0.0 12/12/2012 1036    No results found for this or any previous visit (from the past 240 hour(s)).  Studies/Results: No results found.  Medications: Scheduled Meds: Continuous Infusions: PRN Meds:.    Assessment/Plan:  Explain the weight gain Has associated symptoms of basilar acne, truncal obesity, mood swings, elevated BP, mentrual irregularity.  will check random cortisol and testosterone level with concern for PCOD and cushing's. Check free T4. Follow up ion 1 month  will refer to endocrinology  Refer to dental clinic and opthalmology.  referred to dermatology on last visit     Tipton Ballow 01/08/2013, 12:44 PM

## 2013-01-13 LAB — T4, FREE: Free T4: 1.01 ng/dL (ref 0.80–1.80)

## 2013-01-13 LAB — CORTISOL: Cortisol, Plasma: 3.7 ug/dL

## 2013-01-13 LAB — TESTOSTERONE: Testosterone: 25 ng/dL (ref 10–70)

## 2013-02-12 ENCOUNTER — Ambulatory Visit: Payer: No Typology Code available for payment source | Admitting: Internal Medicine

## 2013-03-13 ENCOUNTER — Encounter: Payer: No Typology Code available for payment source | Admitting: Family Medicine

## 2013-04-16 ENCOUNTER — Emergency Department (HOSPITAL_COMMUNITY)
Admission: EM | Admit: 2013-04-16 | Discharge: 2013-04-16 | Disposition: A | Discharge status: Home / Self Care | Payer: Self-pay

## 2014-05-14 ENCOUNTER — Emergency Department (HOSPITAL_COMMUNITY): Payer: Self-pay

## 2014-05-14 ENCOUNTER — Emergency Department (HOSPITAL_COMMUNITY)
Admission: EM | Admit: 2014-05-14 | Discharge: 2014-05-14 | Disposition: A | Discharge status: Home / Self Care | Payer: Self-pay | Attending: Emergency Medicine | Admitting: Emergency Medicine

## 2014-05-14 ENCOUNTER — Encounter (HOSPITAL_COMMUNITY): Payer: Self-pay | Admitting: *Deleted

## 2014-05-14 DIAGNOSIS — Z791 Long term (current) use of non-steroidal anti-inflammatories (NSAID): Secondary | ICD-10-CM | POA: Insufficient documentation

## 2014-05-14 DIAGNOSIS — Z79899 Other long term (current) drug therapy: Secondary | ICD-10-CM | POA: Insufficient documentation

## 2014-05-14 DIAGNOSIS — R51 Headache: Secondary | ICD-10-CM | POA: Insufficient documentation

## 2014-05-14 DIAGNOSIS — J209 Acute bronchitis, unspecified: Secondary | ICD-10-CM

## 2014-05-14 DIAGNOSIS — M549 Dorsalgia, unspecified: Secondary | ICD-10-CM | POA: Insufficient documentation

## 2014-05-14 LAB — TROPONIN I: Troponin I: <0.03 ng/mL (ref <0.031)

## 2014-05-14 LAB — URINALYSIS, ROUTINE W REFLEX MICROSCOPIC
Bilirubin Urine: NEGATIVE
GLUCOSE, UA: NEGATIVE mg/dL
HGB URINE DIPSTICK: NEGATIVE
Ketones, ur: NEGATIVE mg/dL
LEUKOCYTES UA: NEGATIVE
NITRITE: NEGATIVE
Protein, ur: NEGATIVE mg/dL
SPECIFIC GRAVITY, URINE: 1.016 (ref 1.005–1.030)
UROBILINOGEN UA: 0.2 mg/dL (ref 0.0–1.0)
pH: 6 (ref 5.0–8.0)

## 2014-05-14 LAB — CBC WITH DIFFERENTIAL/PLATELET
BASOS ABS: 0 10*3/uL (ref 0.0–0.1)
Basophils Relative: 0 % (ref 0–1)
EOS PCT: 4 % (ref 0–5)
Eosinophils Absolute: 0.3 10*3/uL (ref 0.0–0.7)
HEMATOCRIT: 34.9 % — AB (ref 36.0–46.0)
HEMOGLOBIN: 11.2 g/dL — AB (ref 12.0–15.0)
LYMPHS ABS: 3.1 10*3/uL (ref 0.7–4.0)
Lymphocytes Relative: 35 % (ref 12–46)
MCH: 26.2 pg (ref 26.0–34.0)
MCHC: 32.1 g/dL (ref 30.0–36.0)
MCV: 81.7 fL (ref 78.0–100.0)
MONO ABS: 0.9 10*3/uL (ref 0.1–1.0)
MONOS PCT: 10 % (ref 3–12)
NEUTROS ABS: 4.6 10*3/uL (ref 1.7–7.7)
NEUTROS PCT: 51 % (ref 43–77)
Platelets: 341 10*3/uL (ref 150–400)
RBC: 4.27 MIL/uL (ref 3.87–5.11)
RDW: 14.5 % (ref 11.5–15.5)
WBC: 8.9 10*3/uL (ref 4.0–10.5)

## 2014-05-14 LAB — COMPREHENSIVE METABOLIC PANEL
ALK PHOS: 95 U/L (ref 39–117)
ALT: 24 U/L (ref 0–35)
ANION GAP: 8 (ref 5–15)
AST: 32 U/L (ref 0–37)
Albumin: 3.6 g/dL (ref 3.5–5.2)
BUN: 14 mg/dL (ref 6–23)
CHLORIDE: 104 mmol/L (ref 96–112)
CO2: 23 mmol/L (ref 19–32)
Calcium: 8.5 mg/dL (ref 8.4–10.5)
Creatinine, Ser: 0.7 mg/dL (ref 0.50–1.10)
GFR calc Af Amer: >90 mL/min (ref >90)
GFR calc non Af Amer: >90 mL/min (ref >90)
Glucose, Bld: 110 mg/dL — ABNORMAL HIGH (ref 70–99)
Potassium: 3.9 mmol/L (ref 3.5–5.1)
Sodium: 135 mmol/L (ref 135–145)
Total Bilirubin: 0.9 mg/dL (ref 0.3–1.2)
Total Protein: 7.5 g/dL (ref 6.0–8.3)

## 2014-05-14 LAB — RAPID STREP SCREEN (MED CTR MEBANE ONLY): STREPTOCOCCUS, GROUP A SCREEN (DIRECT): NEGATIVE

## 2014-05-14 LAB — POC URINE PREG, ED: Preg Test, Ur: NEGATIVE

## 2014-05-14 MED ORDER — NAPROXEN 500 MG PO TABS
500.0000 mg | ORAL_TABLET | Freq: Two times a day (BID) | ORAL | Status: DC
Start: 2014-05-14 — End: 2017-07-28

## 2014-05-14 MED ORDER — ASPIRIN 325 MG PO TABS
325.0000 mg | ORAL_TABLET | Freq: Once | ORAL | Status: AC
  Administered 2014-05-14: 325 mg via ORAL
  Filled 2014-05-14: qty 1

## 2014-05-14 MED ORDER — SODIUM CHLORIDE 0.9 % IV BOLUS (SEPSIS)
1000.0000 mL | Freq: Once | INTRAVENOUS | Status: AC
  Administered 2014-05-14: 1000 mL via INTRAVENOUS

## 2014-05-14 MED ORDER — ALBUTEROL SULFATE (2.5 MG/3ML) 0.083% IN NEBU
2.5000 mg | INHALATION_SOLUTION | Freq: Once | RESPIRATORY_TRACT | Status: AC
  Administered 2014-05-14: 2.5 mg via RESPIRATORY_TRACT
  Filled 2014-05-14: qty 3

## 2014-05-14 NOTE — ED Notes (Signed)
Pt states she has been sick x 1 week with sore throat, cough (yellow sputum). Has had violent cough and now has right shoulder and neck pain. Denies paresthesias. Anterior CP with cough.

## 2014-05-14 NOTE — ED Provider Notes (Signed)
Patient presents today for evaluation of URI symptoms with cough and wheezing.  Patient initially with reproducible right anterior chest wall pain.  After completion of albuterol treatment, patient developed mid-sternal and left sided chest pain.  ECG reveals T wave changes in anterior leads.  Patient given 3325 mg aspirin and moved to main ED for completion of cardiac workup.  Central chest pain has improved.  Cough persists, as well as right sided chest wall pain with mild end-expiratory wheezing noted.  9:18 PM Troponin negative x 2.  Repeat ECG continues to exhibit anterior T wave changes, but improved.  No return of central chest pain. Heart score of 3, low risk of MACE.  Discharged home with instructions for viral bronchitis.  Follow-up with PCP.  Jimmye Normanavid John Raeshawn Vo, NP 05/15/14 [id number]  Purvis SheffieldForrest Harrison, MD 05/15/14 [phone]

## 2014-05-14 NOTE — ED Notes (Signed)
Pt reports not feeling well x 1 week. Having headache, productive cough with yellow sputum, sore throat and generalized body aches. Denies n/v/d.

## 2014-05-14 NOTE — Discharge Instructions (Signed)
Acute Bronchitis Bronchitis is inflammation of the airways that extend from the windpipe into the lungs (bronchi). The inflammation often causes mucus to develop. This leads to a cough, which is the most common symptom of bronchitis.  In acute bronchitis, the condition usually develops suddenly and goes away over time, usually in a couple weeks. Smoking, allergies, and asthma can make bronchitis worse. Repeated episodes of bronchitis may cause further lung problems.  CAUSES Acute bronchitis is most often caused by the same virus that causes a cold. The virus can spread from person to person (contagious) through coughing, sneezing, and touching contaminated objects. SIGNS AND SYMPTOMS   Cough.   Fever.   Coughing up mucus.   Body aches.   Chest congestion.   Chills.   Shortness of breath.   Sore throat.  DIAGNOSIS  Acute bronchitis is usually diagnosed through a physical exam. Your health care provider will also ask you questions about your medical history. Tests, such as chest X-rays, are sometimes done to rule out other conditions.  TREATMENT  Acute bronchitis usually goes away in a couple weeks. Oftentimes, no medical treatment is necessary. Medicines are sometimes given for relief of fever or cough. Antibiotic medicines are usually not needed but may be prescribed in certain situations. In some cases, an inhaler may be recommended to help reduce shortness of breath and control the cough. A cool mist vaporizer may also be used to help thin bronchial secretions and make it easier to clear the chest.  HOME CARE INSTRUCTIONS  Get plenty of rest.   Drink enough fluids to keep your urine clear or pale yellow (unless you have a medical condition that requires fluid restriction). Increasing fluids may help thin your respiratory secretions (sputum) and reduce chest congestion, and it will prevent dehydration.   Take medicines only as directed by your health care provider.  If  you were prescribed an antibiotic medicine, finish it all even if you start to feel better.  Avoid smoking and secondhand smoke. Exposure to cigarette smoke or irritating chemicals will make bronchitis worse. If you are a smoker, consider using nicotine gum or skin patches to help control withdrawal symptoms. Quitting smoking will help your lungs heal faster.   Reduce the chances of another bout of acute bronchitis by washing your hands frequently, avoiding people with cold symptoms, and trying not to touch your hands to your mouth, nose, or eyes.   Keep all follow-up visits as directed by your health care provider.  SEEK MEDICAL CARE IF: Your symptoms do not improve after 1 week of treatment.  SEEK IMMEDIATE MEDICAL CARE IF:  You develop an increased fever or chills.   You have chest pain.   You have severe shortness of breath.  You have bloody sputum.   You develop dehydration.  You faint or repeatedly feel like you are going to pass out.  You develop repeated vomiting.  You develop a severe headache. MAKE SURE YOU:   Understand these instructions.  Will watch your condition.  Will get help right away if you are not doing well or get worse. Document Released: 04/19/2004 Document Revised: 07/27/2013 Document Reviewed: 09/02/2012 [facility] Patient Information 2015 [employer], [place]. This information is not intended to replace advice given to you by your health care provider. Make sure you discuss any questions you have with your health care provider. Chest Pain (Nonspecific) It is often hard to give a diagnosis for the cause of chest pain. There is always a chance that your  chance that your pain could be related to something serious, such as a heart attack or a blood clot in the lungs. You need to follow up with your doctor. °HOME CARE °· If antibiotic medicine was given, take it as directed by your doctor. Finish the medicine even if you start to feel better. °· For the  next few days, avoid activities that bring on chest pain. Continue physical activities as told by your doctor. °· Do not use any tobacco products. This includes cigarettes, chewing tobacco, and e-cigarettes. °· Avoid drinking alcohol. °· Only take medicine as told by your doctor. °· Follow your doctor's suggestions for more testing if your chest pain does not go away. °· Keep all doctor visits you made. °GET HELP IF: °· Your chest pain does not go away, even after treatment. °· You have a rash with blisters on your chest. °· You have a fever. °GET HELP RIGHT AWAY IF:  °· You have more pain or pain that spreads to your arm, neck, jaw, back, or belly (abdomen). °· You have shortness of breath. °· You cough more than usual or cough up blood. °· You have very bad back or belly pain. °· You feel sick to your stomach (nauseous) or throw up (vomit). °· You have very bad weakness. °· You pass out (faint). °· You have chills. °This is an emergency. Do not wait to see if the problems will go away. Call your local emergency services (911 in [place] Do not drive yourself to the hospital. °MAKE SURE YOU:  °· Understand these instructions. °· Will watch your condition. °· Will get help right away if you are not doing well or get worse. °Document Released: 08/29/2007 Document Revised: 03/17/2013 Document Reviewed: 08/29/2007 °Exit[employer]® Patient Information ©2015 [employer]. This information is not intended to replace advice given to you by your health care provider. Make sure you discuss any questions you have with your health care provider. ° °

## 2014-05-14 NOTE — ED Provider Notes (Signed)
CSN: [id number]     Arrival date & time 05/14/14  1404 History  This chart was scribed for Raymon MuttonMarissa Frances Ambrosino, PA-C, working with American Expressathan R. Rubin PayorPickering, MD by Chestine SporeSoijett Blue, ED Scribe. The patient was seen in room TR07C/TR07C at 3:12 PM.    Chief Complaint  Patient presents with  . Sore Throat  . Cough     The history is provided by the patient. No language interpreter was used.    HPI Comments: Tina Acosta is a 49 y.o. female with no significant PMHx who presents to the Emergency Department complaining of sore throat onset 1 week. She states that she is having worsening associated symptoms of right sided HA, productive cough with yellow sputum, generalized body aches, sharp right eye pain, wheezing, right sided chest tightness while coughing, sinus pressure, nasal congestion. She states that she has tried Tylenol, Ibuprofen, Robitussin with no relief for her symptoms. She denies n/v/d, melena, hematochezial, CP, SOB, difficulty breathing, blurred vision, sudden loss of vision, fever, chills, dizziness, fainting, abdominal pain, numbness/tingling, weakness, hemopytsis, leg swelling, and appetite change. Pt has had the flu vaccine. Pt works in Nursing home.  PCP none   History reviewed. No pertinent past medical history. Past Surgical History  Procedure Laterality Date  . Gallbladder surgery    . Tubal ligation     History reviewed. No pertinent family history. History  Substance Use Topics  . Smoking status: Never Smoker   . Smokeless tobacco: Not on file  . Alcohol Use: No   OB History    No data available     Review of Systems  Constitutional: Negative for fever, chills and appetite change.  HENT: Positive for congestion, sinus pressure and sore throat.   Eyes: Positive for pain. Negative for visual disturbance.  Respiratory: Positive for cough and wheezing. Negative for shortness of breath.   Cardiovascular: Positive for chest pain (right sided while coughing). Negative for leg  swelling.  Gastrointestinal: Negative for nausea, vomiting, abdominal pain, diarrhea and blood in stool.  Genitourinary: Negative for dysuria, urgency and frequency.  Musculoskeletal: Positive for back pain and neck pain.  Neurological: Positive for headaches. Negative for dizziness, syncope, weakness and numbness.      Allergies  Review of patient's allergies indicates no known allergies.  Home Medications   Prior to Admission medications   Medication Sig Start Date End Date Taking? Authorizing Provider  bisacodyl (DULCOLAX) 5 MG EC tablet Take 1 tablet (5 mg total) by mouth 2 (two) times daily. 12/25/12   Mirian MoMatthew Gentry, MD  bisacodyl (FLEET) 10 MG/30ML ENEM Place 30 mLs (10 mg total) rectally once. 12/25/12   Mirian MoMatthew Gentry, MD  naproxen sodium (ANAPROX) 220 MG tablet Take 660 mg by mouth 2 (two) times daily with a meal.    Historical Provider, MD  ondansetron (ZOFRAN) 4 MG tablet Take 1 tablet (4 mg total) by mouth every 6 (six) hours. 12/25/12   Mirian MoMatthew Gentry, MD  polyethylene glycol (GOLYTELY) 236 G solution Take 2,000 mLs by mouth once. 12/25/12   Mirian MoMatthew Gentry, MD   BP 120/73 mmHg  Pulse 81  Temp(Src) 97.9 F (36.6 C) (Oral)  Resp 18  SpO2 99%  LMP 05/07/2014  Physical Exam  Constitutional: She is oriented to person, place, and time. She appears well-developed and well-nourished. No distress.  HENT:  Head: Normocephalic and atraumatic.  Mouth/Throat: No trismus in the jaw. No dental abscesses, uvula swelling, lacerations or dental caries. Posterior oropharyngeal erythema (mild) present. No oropharyngeal exudate, posterior  oropharyngeal edema or tonsillar abscesses.  Eyes: Conjunctivae and EOM are normal. Pupils are equal, round, and reactive to light. Right eye exhibits no discharge. Left eye exhibits no discharge.  Neck: Normal range of motion. Neck supple. No tracheal deviation present.  Cardiovascular: Normal rate, regular rhythm and normal heart sounds.   Pulses:       Radial pulses are 2+ on the right side, and 2+ on the left side.  Pulmonary/Chest: Effort normal. No respiratory distress. She has wheezes. She has no rales. She exhibits tenderness (Pain reproducible upon palpation to the right side of the chest wall).  Subtle expiratory wheezes identified to the lower lobes bilaterally Negative use of accessory muscles Negative stridor  Musculoskeletal: Normal range of motion.  Lymphadenopathy:    She has no cervical adenopathy.  Neurological: She is alert and oriented to person, place, and time. No cranial nerve deficit. She exhibits normal muscle tone. Coordination normal.  Skin: Skin is warm and dry. No rash noted. She is not diaphoretic. No erythema.  Psychiatric: She has a normal mood and affect. Her behavior is normal. Thought content normal.  Nursing note and vitals reviewed.   ED Course  Procedures (including critical care time) DIAGNOSTIC STUDIES: Oxygen Saturation is 99% on room air, normal by my interpretation.    COORDINATION OF CARE: 3:20 PM-Discussed treatment plan which includes rapid strep screen and culture and CXR with pt at bedside and pt agreed to plan.   Results for orders placed or performed during the hospital encounter of 05/14/14  Rapid strep screen  Result Value Ref Range   Streptococcus, Group A Screen (Direct) NEGATIVE NEGATIVE  CBC with Differential/Platelet  Result Value Ref Range   WBC 8.9 4.0 - 10.5 K/uL   RBC 4.27 3.87 - 5.11 MIL/uL   Hemoglobin 11.2 (L) 12.0 - 15.0 g/dL   HCT 16.1 (L) 09.6 - 04.5 %   MCV 81.7 78.0 - 100.0 fL   MCH 26.2 26.0 - 34.0 pg   MCHC 32.1 30.0 - 36.0 g/dL   RDW 40.9 81.1 - 91.4 %   Platelets 341 150 - 400 K/uL   Neutrophils Relative % 51 43 - 77 %   Neutro Abs 4.6 1.7 - 7.7 K/uL   Lymphocytes Relative 35 12 - 46 %   Lymphs Abs 3.1 0.7 - 4.0 K/uL   Monocytes Relative 10 3 - 12 %   Monocytes Absolute 0.9 0.1 - 1.0 K/uL   Eosinophils Relative 4 0 - 5 %   Eosinophils Absolute 0.3 0.0  - 0.7 K/uL   Basophils Relative 0 0 - 1 %   Basophils Absolute 0.0 0.0 - 0.1 K/uL    Labs Review Labs Reviewed  CBC WITH DIFFERENTIAL/PLATELET - Abnormal; Notable for the following:    Hemoglobin 11.2 (*)    HCT 34.9 (*)    All other components within normal limits  RAPID STREP SCREEN  CULTURE, GROUP A STREP  COMPREHENSIVE METABOLIC PANEL  TROPONIN I  URINALYSIS, ROUTINE W REFLEX MICROSCOPIC  POC URINE PREG, ED    Imaging Review Dg Chest 2 View  05/14/2014   CLINICAL DATA:  Right side chest pain, wheezing  EXAM: CHEST  2 VIEW  COMPARISON:  09/03/2011  FINDINGS: Cardiomediastinal silhouette is stable. No acute infiltrate or pleural effusion. No pulmonary edema. Minimal perihilar bronchitic changes. Mild degenerative changes thoracic spine.  IMPRESSION: No acute infiltrate or pulmonary edema. Minimal perihilar bronchitic changes. Mild degenerative changes thoracic spine.   Electronically Signed   By:  Natasha Mead M.D.   On: 05/14/2014 15:35     EKG Interpretation   Date/Time:  Friday May 14 2014 16:38:29 EST Ventricular Rate:  86 PR Interval:  170 QRS Duration: 102 QT Interval:  382 QTC Calculation: 457 R Axis:   51 Text Interpretation:  Normal sinus rhythm T wave abnormality, consider  anterior ischemia , new since prior tracing Abnormal ECG Confirmed by  Mirian Mo [phone]) on 05/14/2014 4:55:09 PM      MDM   Final diagnoses:  None    Medications  albuterol (PROVENTIL) (2.5 MG/3ML) 0.083% nebulizer solution 2.5 mg (2.5 mg Nebulization Given 05/14/14 1553)  sodium chloride 0.9 % bolus 1,000 mL (1,000 mLs Intravenous New Bag/Given 05/14/14 1730)  aspirin tablet 325 mg (325 mg Oral Given 05/14/14 1736)    Filed Vitals:   05/14/14 1529 05/14/14 1728  BP: 120/73 104/67  Pulse: 81 76  Temp: 97.9 F (36.6 C) 98.6 F (37 C)  TempSrc: Oral Oral  Resp: 18 12  SpO2: 99% 95%   I personally performed the services described in this documentation, which was scribed  in my presence. The recorded information has been reviewed and is accurate.   Rapid strep test negative. Chest x-ray noted infiltrate or pulmonary edema. Minimal periilar bronchitic changes. Moderate changes noted thoracic spine.  4:51 PM Patient given albuterol neb treatment - reported better breathing, felt wheezing decrease. Lungs clear to auscultation to upper and lower lobes bilaterally - good lung expansion. Patient reporting chest pain that has now gone to the center of her chest. EKG ordered. EKG read by Dr. Judie Petit. Genrty who reported new T wave inversions in the anterior leads. Orders placed and patient to be moved to main ED for further work up.    Raymon Mutton, PA-C 05/14/14 1803  Juliet Rude. Rubin Payor, MD 05/18/14 [phone]

## 2014-05-16 LAB — CULTURE, GROUP A STREP: Strep A Culture: NEGATIVE

## 2014-09-21 IMAGING — CT CT HEAD W/O CM
3 of 5 series · 16 of 47 positions shown, 19 images · non-contrast
Comparison: None

CT HEAD

CLINICAL DATA: Parasthesias.  Intermittent paralysis of the left
arm.  Sensory disturbance.

CT HEAD WITHOUT CONTRAST
CT CERVICAL SPINE WITHOUT CONTRAST
TECHNIQUE: Multidetector CT imaging of the head and cervical spine
was performed following the standard protocol without intravenous
contrast.  Multiplanar CT image reconstructions of the cervical
spine were also generated.

[Series 602: cor · coronal · 0.36mm/px · 3 of 38 slices shown]
[im 13/38  brain]
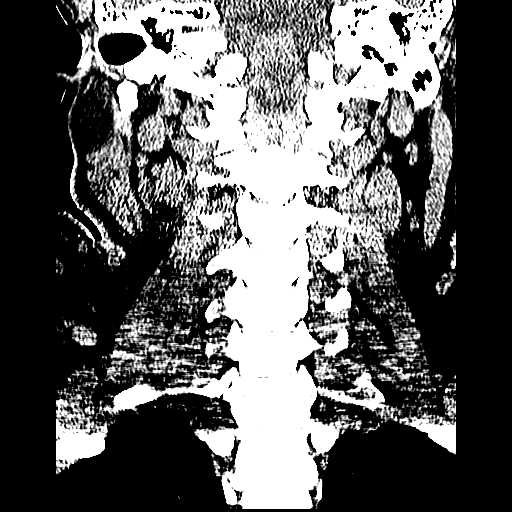
[im 17/38  brain]
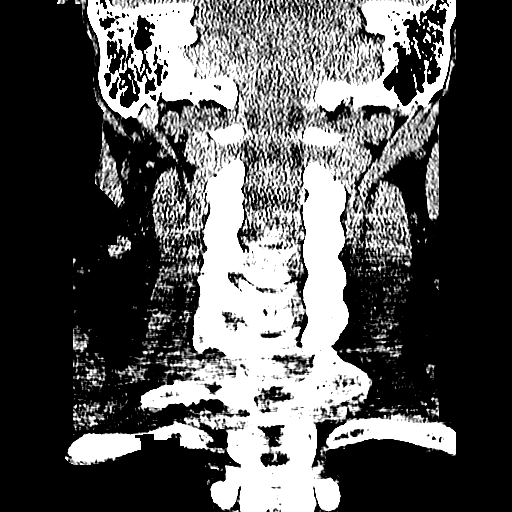
[im 21/38  brain]
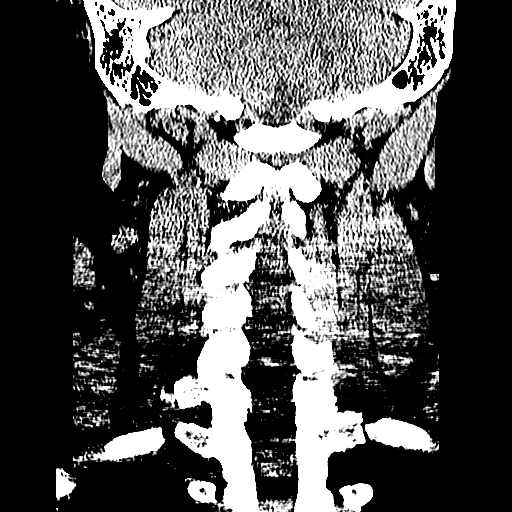

[Series 603: axial · axial · 0.36mm/px · z∈[-329,-198]mm · 10 of 87 slices shown, 13 images]
[im 8/87  brain]
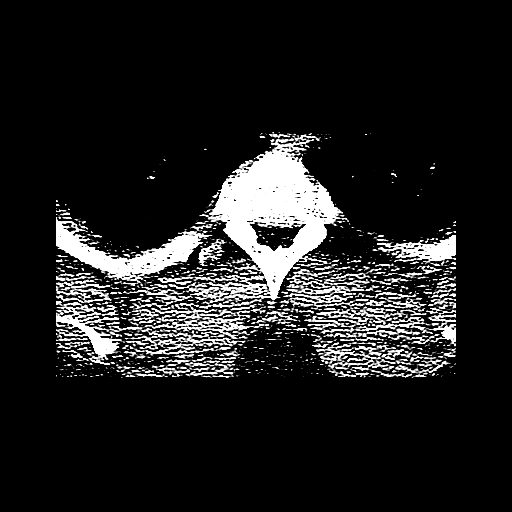
[im 8/87  bone]
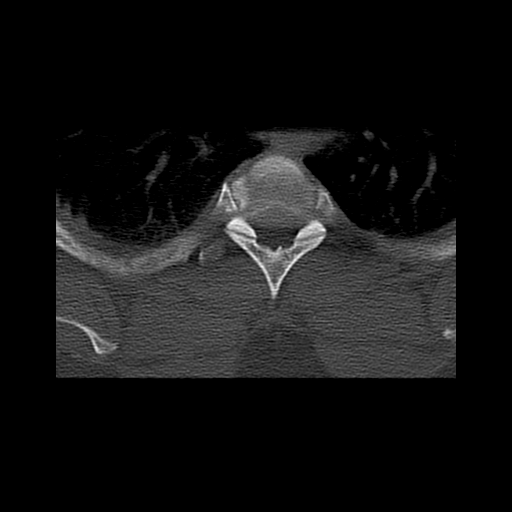
[im 16/87  brain]
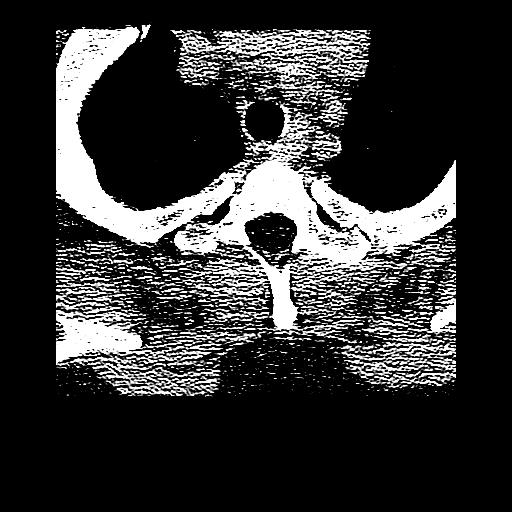
[im 24/87  brain]
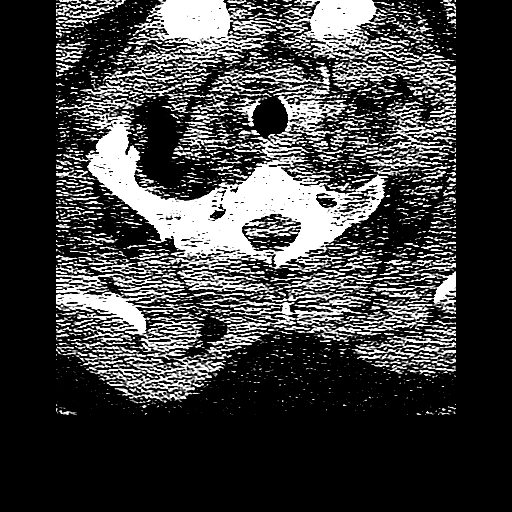
[im 32/87  brain]
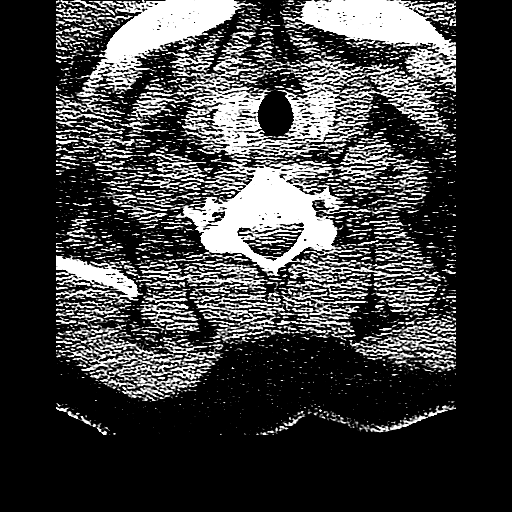
[im 40/87  brain]
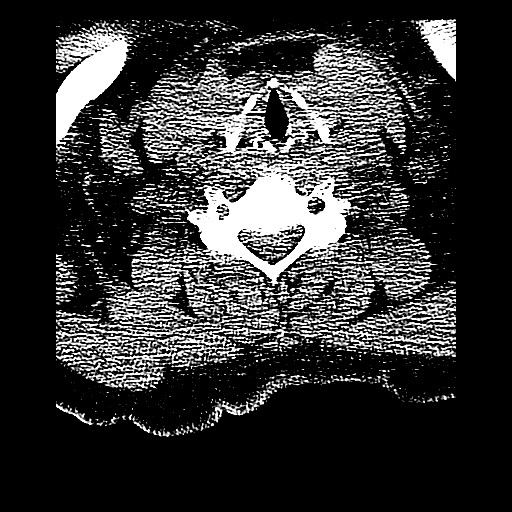
[im 40/87  bone]
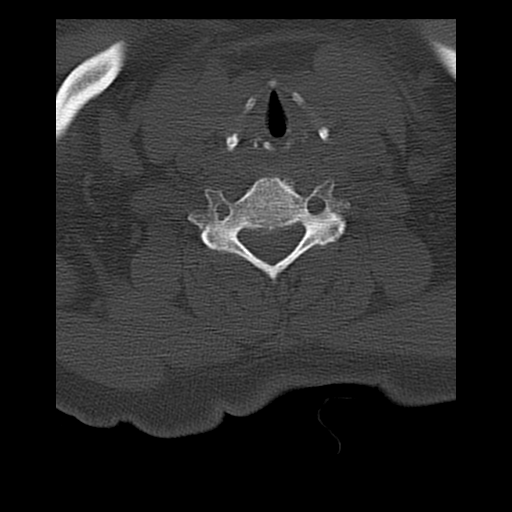
[im 47/87  brain]
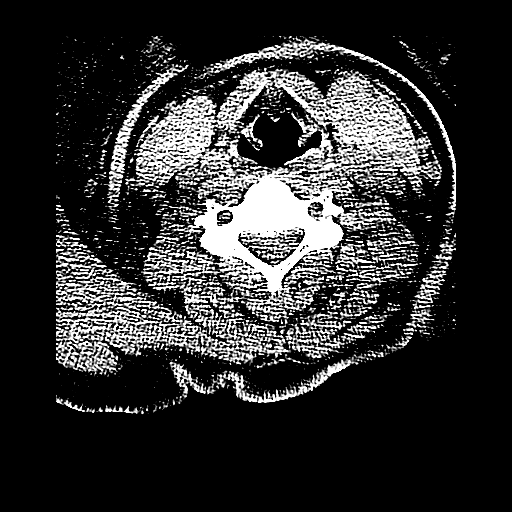
[im 55/87  brain]
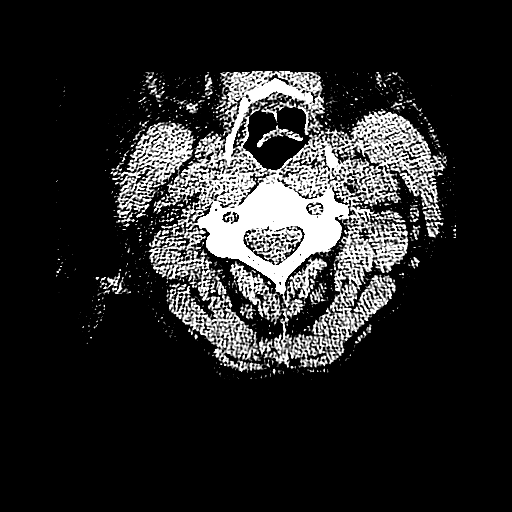
[im 63/87  brain]
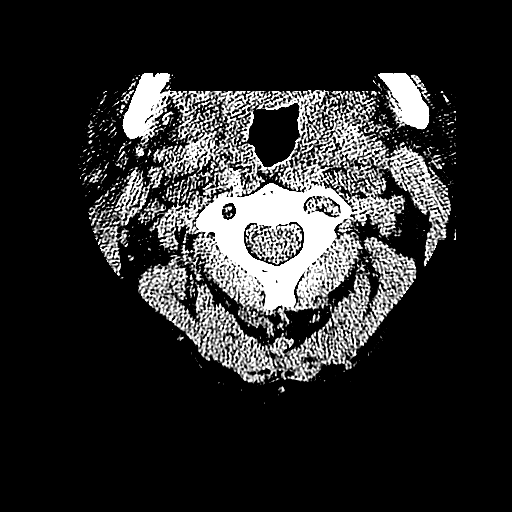
[im 71/87  brain]
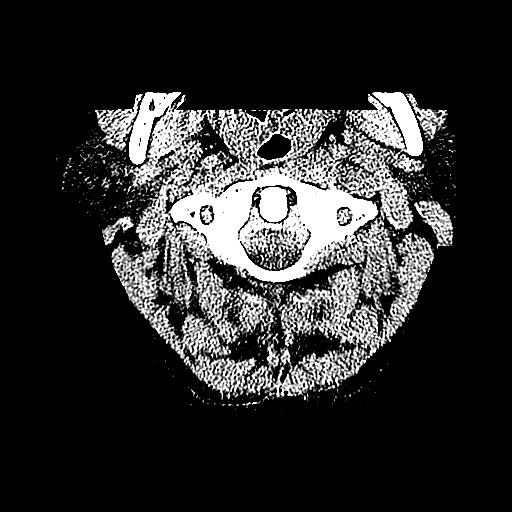
[im 71/87  bone]
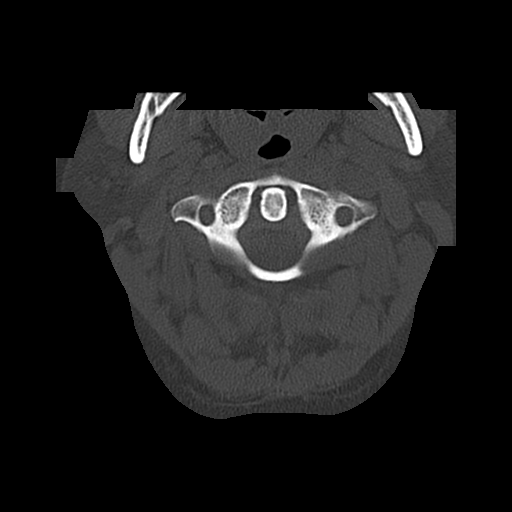
[im 79/87  brain]
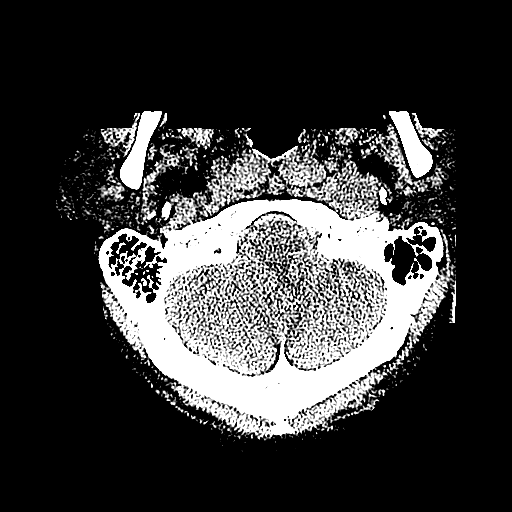

[Series 605: sag · sagittal · 0.36mm/px · 3 of 45 slices shown]
[im 15/45  brain]
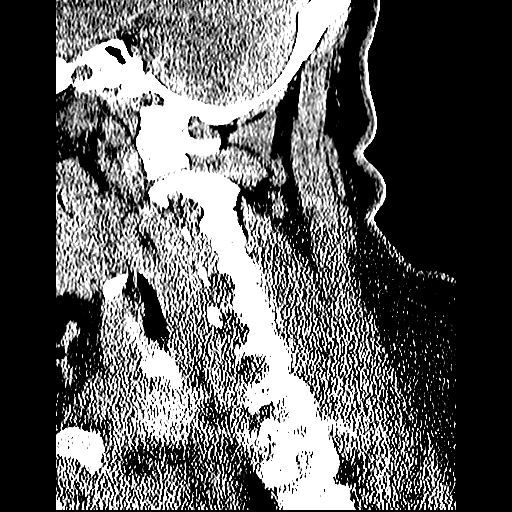
[im 23/45  brain]
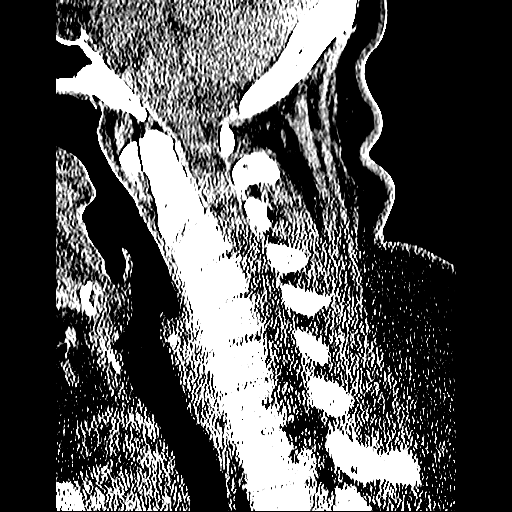
[im 30/45  brain]
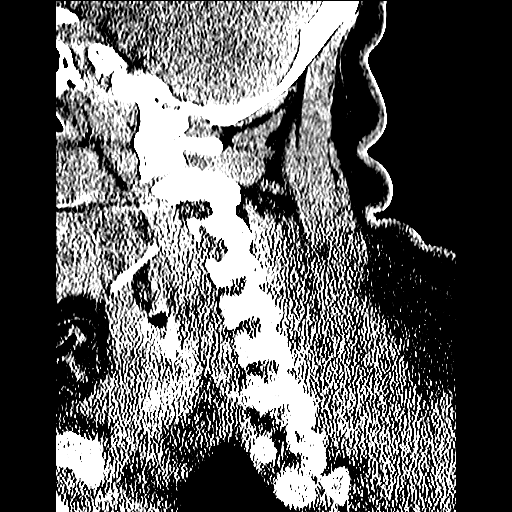

[16 of 47 positions shown; findings below may reference images not displayed]

FINDINGS: No mass lesion, mass effect, midline shift,
hydrocephalus, hemorrhage.  No territorial ischemia or acute
infarction.  Paranasal sinuses appear within normal limits.
IMPRESSION: Negative CT head.

CT CERVICAL SPINE
FINDINGS: Reversal of the normal cervical lordosis.  No cervical
spine fracture dislocation.  The study is technically degraded by
body habitus.  No bony central stenosis is identified.
Craniocervical alignment is normal.  Odontoid intact.  Mandibular
condyles located.  Occipital condyles appear normal.  Lung apices
appear within normal limits.  Bilateral cervical ribs are present.
Large cervical rib on the right, with pseudoarthrosis between the
cervical rib and the true right first rib.  Left cervical rib is
rudimentary.

Short pedicles are present in the cervical spine.  There is
foraminal encroachment at C2-C3 and C3-C4 potentially affecting the
C3 and C4 nerves in this patient with left upper extremity
symptoms.
IMPRESSION: No acute osseous abnormality.  Loss of normal cervical lordosis is
probably positional.  Foraminal encroachment that could potentially
affects the left C3 and C4 nerves.

## 2015-03-16 NOTE — Telephone Encounter (Signed)
error 

## 2015-04-23 ENCOUNTER — Encounter (HOSPITAL_COMMUNITY): Payer: Self-pay | Admitting: *Deleted

## 2015-04-23 ENCOUNTER — Emergency Department (HOSPITAL_COMMUNITY): Payer: Self-pay

## 2015-04-23 ENCOUNTER — Emergency Department (HOSPITAL_COMMUNITY)
Admission: EM | Admit: 2015-04-23 | Discharge: 2015-04-23 | Disposition: A | Discharge status: Home / Self Care | Payer: Self-pay | Attending: Emergency Medicine | Admitting: Emergency Medicine

## 2015-04-23 DIAGNOSIS — R0782 Intercostal pain: Secondary | ICD-10-CM | POA: Insufficient documentation

## 2015-04-23 DIAGNOSIS — J40 Bronchitis, not specified as acute or chronic: Secondary | ICD-10-CM

## 2015-04-23 DIAGNOSIS — B349 Viral infection, unspecified: Secondary | ICD-10-CM

## 2015-04-23 LAB — CBC WITH DIFFERENTIAL/PLATELET
BASOS ABS: 0 10*3/uL (ref 0.0–0.1)
BASOS PCT: 0 %
EOS PCT: 3 %
Eosinophils Absolute: 0.2 10*3/uL (ref 0.0–0.7)
HCT: 37.8 % (ref 36.0–46.0)
Hemoglobin: 11.8 g/dL — ABNORMAL LOW (ref 12.0–15.0)
LYMPHS PCT: 41 %
Lymphs Abs: 3 10*3/uL (ref 0.7–4.0)
MCH: 26.2 pg (ref 26.0–34.0)
MCHC: 31.2 g/dL (ref 30.0–36.0)
MCV: 83.8 fL (ref 78.0–100.0)
Monocytes Absolute: 0.5 10*3/uL (ref 0.1–1.0)
Monocytes Relative: 7 %
Neutro Abs: 3.5 10*3/uL (ref 1.7–7.7)
Neutrophils Relative %: 49 %
PLATELETS: 288 10*3/uL (ref 150–400)
RBC: 4.51 MIL/uL (ref 3.87–5.11)
RDW: 14.8 % (ref 11.5–15.5)
WBC: 7.3 10*3/uL (ref 4.0–10.5)

## 2015-04-23 LAB — I-STAT CHEM 8, ED
BUN: 17 mg/dL (ref 6–20)
CHLORIDE: 101 mmol/L (ref 101–111)
Calcium, Ion: 1.11 mmol/L — ABNORMAL LOW (ref 1.12–1.23)
Creatinine, Ser: 0.6 mg/dL (ref 0.44–1.00)
Glucose, Bld: 133 mg/dL — ABNORMAL HIGH (ref 65–99)
HEMATOCRIT: 41 % (ref 36.0–46.0)
HEMOGLOBIN: 13.9 g/dL (ref 12.0–15.0)
POTASSIUM: 3.9 mmol/L (ref 3.5–5.1)
SODIUM: 138 mmol/L (ref 135–145)
TCO2: 24 mmol/L (ref 0–100)

## 2015-04-23 MED ORDER — OSELTAMIVIR PHOSPHATE 75 MG PO CAPS: 75.0000 mg | ORAL_CAPSULE | Freq: Every day | ORAL | Status: DC

## 2015-04-23 MED ORDER — BENZONATATE 100 MG PO CAPS
200.0000 mg | ORAL_CAPSULE | Freq: Once | ORAL | Status: AC
  Administered 2015-04-23: 200 mg via ORAL
  Filled 2015-04-23: qty 2

## 2015-04-23 MED ORDER — OSELTAMIVIR PHOSPHATE 75 MG PO CAPS
75.0000 mg | ORAL_CAPSULE | Freq: Once | ORAL | Status: AC
  Administered 2015-04-23: 75 mg via ORAL
  Filled 2015-04-23: qty 1

## 2015-04-23 MED ORDER — BENZONATATE 200 MG PO CAPS: 200.0000 mg | ORAL_CAPSULE | Freq: Three times a day (TID) | ORAL | Status: DC | PRN

## 2015-04-23 NOTE — ED Notes (Signed)
The pt is c/o a headache cold cough diarrhea  Chest congestion and she has had entire body aches for 3 weeks.  Her cough is productive thick yellow sputum  Unknown if she has had a temp

## 2015-04-23 NOTE — ED Provider Notes (Signed)
CSN: [id number]     Arrival date & time 04/23/15  2019 History   First MD Initiated Contact with Patient 04/23/15 2144     Chief Complaint  Patient presents with  . multiple complaints      (Consider location/radiation/quality/duration/timing/severity/associated sxs/prior Treatment) HPI Comments: This is a 50 year old morbidly obese female, who presents with three-week history of nasal congestion, sinus pressure, mostly on the right, headache, myalgias and productive cough.  She states she been taking over-the-counter medication without any relief.  She reports chills but is unaware she's had temperature.  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 She does not have a primary care physician  The history is provided by the patient.    History reviewed. No pertinent past medical history. Past Surgical History  Procedure Laterality Date  . Gallbladder surgery    . Tubal ligation     No family history on file. Social History  Substance Use Topics  . Smoking status: Never Smoker   . Smokeless tobacco: None  . Alcohol Use: No   OB History    No data available     Review of Systems  Constitutional: Negative for fever.  HENT: Positive for congestion. Negative for rhinorrhea.   Respiratory: Negative for shortness of breath.   Cardiovascular: Positive for chest pain.  Musculoskeletal: Positive for myalgias.  Skin: Negative for rash.  Neurological: Positive for headaches.  All other systems reviewed and are negative.     Allergies  Review of patient's allergies indicates no known allergies.  Home Medications   Prior to Admission medications   Medication Sig Start Date End Date Taking? Authorizing Provider  guaiFENesin-dextromethorphan (ROBITUSSIN DM) 100-10 MG/5ML syrup Take 15 mLs by mouth every 6 (six) hours as needed for cough.   Yes Historical Provider, MD  ibuprofen (ADVIL,MOTRIN) 200 MG tablet  Take 800 mg by mouth every 8 (eight) hours as needed for moderate pain.   Yes Historical Provider, MD  benzonatate (TESSALON) 200 MG capsule Take 1 capsule (200 mg total) by mouth 3 (three) times daily as needed for cough. 04/23/15   Earley Favor, NP  bisacodyl (DULCOLAX) 5 MG EC tablet Take 1 tablet (5 mg total) by mouth 2 (two) times daily. Patient not taking: Reported on 05/14/2014 12/25/12   Mirian Mo, MD  bisacodyl (FLEET) 10 MG/30ML ENEM Place 30 mLs (10 mg total) rectally once. Patient not taking: Reported on 05/14/2014 12/25/12   Mirian Mo, MD  naproxen (NAPROSYN) 500 MG tablet Take 1 tablet (500 mg total) by mouth 2 (two) times daily. Patient not taking: Reported on 04/23/2015 05/14/14   Felicie Morn, NP  naproxen sodium (ANAPROX) 220 MG tablet Take 660 mg by mouth 2 (two) times daily as needed. For pain    Historical Provider, MD  ondansetron (ZOFRAN) 4 MG tablet Take 1 tablet (4 mg total) by mouth every 6 (six) hours. Patient not taking: Reported on 05/14/2014 12/25/12   Mirian Mo, MD  oseltamivir (TAMIFLU) 75 MG capsule Take 1 capsule (75 mg total) by mouth daily. 04/23/15   Earley Favor, NP  polyethylene glycol (GOLYTELY) 236 G solution Take 2,000 mLs by mouth once. Patient not taking: Reported on 05/14/2014 12/25/12   Mirian Mo, MD   BP 127/79 mmHg  Pulse 79  Temp(Src) 97.6 F (36.4 C) (Oral)  Resp 16  Wt 153.134 kg  SpO2 100%  LMP 03/31/2015 Physical Exam  [id number] is  oriented to person, place, and time. She appears well-developed and well-nourished.  HENT:  Head: Normocephalic.  Right Ear: External ear normal.  Left Ear: External ear normal.  Mouth/Throat: Oropharynx is clear and moist.  Eyes: Pupils are equal, round, and reactive to light.  Neck: Normal range of motion.  Cardiovascular: Normal rate.   Pulmonary/Chest: Effort normal.  Abdominal: Soft.  Musculoskeletal: Normal range of motion.  Neurological: She is alert and oriented to person,  place, and time.  Skin: Skin is warm.  Nursing note and vitals reviewed.   ED Course  Procedures (including critical care time) Labs Review Labs Reviewed  CBC WITH DIFFERENTIAL/PLATELET - Abnormal; Notable for the following:    Hemoglobin 11.8 (*)    All other components within normal limits  I-STAT CHEM 8, ED - Abnormal; Notable for the following:    Glucose, Bld 133 (*)    Calcium, Ion 1.11 (*)    All other components within normal limits    Imaging Review Dg Chest 2 View  04/23/2015  CLINICAL DATA:  Initial evaluation for productive cough with hemoptysis. EXAM: CHEST  2 VIEW COMPARISON:  Prior study from 05/14/2014. FINDINGS: Cardiac and mediastinal silhouettes are stable in size and contour, and remain within normal limits. Lungs are mildly hypoinflated. Mild diffuse bronchitic changes present. No consolidative airspace opacity. No pulmonary edema or pleural effusion. No pneumothorax. No acute osseous abnormality. Degenerative spondylolysis noted within the visualized spine. IMPRESSION: Mild diffuse bronchitic changes. No consolidative airspace opacity to suggest pneumonia. Electronically Signed   By: Rise Mu M.D.   On: 04/23/2015 22:31   I have personally reviewed and evaluated these images and lab results as part of my medical decision-making.   EKG Interpretation None    , although patient states that she's been ill for the past 3 weeks.  She did an acute change in her symptoms.  A proximally 2 days ago, worsening myalgias, cough.  She will be started on Tamiflu as a precaution.  Her labs and x-ray are all within normal parameters.  She will be given Jerilynn Som for symptomatic relief of her cough  MDM   Final diagnoses:  Bronchitis  Viral illness         Earley Favor, NP 04/23/15 2332  Laurence Spates, MD 04/24/15 [phone]

## 2016-02-13 ENCOUNTER — Emergency Department (HOSPITAL_COMMUNITY): Payer: Self-pay

## 2016-02-13 ENCOUNTER — Emergency Department (HOSPITAL_COMMUNITY)
Admission: EM | Admit: 2016-02-13 | Discharge: 2016-02-13 | Disposition: A | Discharge status: Home / Self Care | Payer: Self-pay | Attending: Emergency Medicine | Admitting: Emergency Medicine

## 2016-02-13 ENCOUNTER — Encounter (HOSPITAL_COMMUNITY): Payer: Self-pay | Admitting: Emergency Medicine

## 2016-02-13 DIAGNOSIS — M546 Pain in thoracic spine: Secondary | ICD-10-CM

## 2016-02-13 DIAGNOSIS — W228XXA Striking against or struck by other objects, initial encounter: Secondary | ICD-10-CM | POA: Insufficient documentation

## 2016-02-13 DIAGNOSIS — W19XXXA Unspecified fall, initial encounter: Secondary | ICD-10-CM

## 2016-02-13 DIAGNOSIS — Y999 Unspecified external cause status: Secondary | ICD-10-CM | POA: Insufficient documentation

## 2016-02-13 DIAGNOSIS — Y9289 Other specified places as the place of occurrence of the external cause: Secondary | ICD-10-CM | POA: Insufficient documentation

## 2016-02-13 DIAGNOSIS — Y939 Activity, unspecified: Secondary | ICD-10-CM | POA: Insufficient documentation

## 2016-02-13 DIAGNOSIS — M25562 Pain in left knee: Secondary | ICD-10-CM

## 2016-02-13 MED ORDER — HYDROCODONE-ACETAMINOPHEN 5-325 MG PO TABS
2.0000 | ORAL_TABLET | Freq: Once | ORAL | Status: AC
  Administered 2016-02-13: 2 via ORAL
  Filled 2016-02-13: qty 2

## 2016-02-13 MED ORDER — HYDROCODONE-ACETAMINOPHEN 5-325 MG PO TABS: 1.0000 | ORAL_TABLET | Freq: Four times a day (QID) | ORAL | 0 refills | Status: DC | PRN

## 2016-02-13 NOTE — ED Triage Notes (Signed)
Patient slipped and fell at kitchen this evening , reports pain at left knee and left lower back , denies LOC/ambulatory , pain increases with movement . .Marland Kitchen

## 2016-02-13 NOTE — ED Provider Notes (Signed)
[facility]-EMERGENCY DEPT Provider Note   CSN: [id number] Arrival date & time: 02/13/16  2229  By signing my name below, I, Morene CrockerKevin Le, attest that this documentation has been prepared under the direction and in the presence of Roxy Horsemanobert Carinne Brandenburger, PA-C. Electronically Signed: Morene CrockerKevin Le, Scribe. 02/13/16. 10:48 PM.  History   Chief Complaint Chief Complaint  Patient presents with  . Fall    Knee/Back pain     The history is provided by the patient. No language interpreter was used.   HPI Comments: Ronal Fearileen Y Modisette is a 50 y.o. female who presents to the Emergency Department complaining of gradually worsening left knee pain s/p fall that occurred this evening. The pain is exacerbated with movement. She reports she slipped in her kitchen and struck her back on a freezer corner; reports associated back pain at this time. She is ambulatory, but has a limp.   History reviewed. No pertinent past medical history.  Patient Active Problem List   Diagnosis Date Noted  . Weight gain 12/12/2012  . Elevated BP 12/12/2012  . Acne 12/12/2012  . Odontalgia 12/12/2012  . Periodic health assessment, general screening, adult 12/12/2012    Past Surgical History:  Procedure Laterality Date  . GALLBLADDER SURGERY    . TUBAL LIGATION      OB History    No data available       Home Medications    Prior to Admission medications   Medication Sig Start Date End Date Taking? Authorizing Provider  benzonatate (TESSALON) 200 MG capsule Take 1 capsule (200 mg total) by mouth 3 (three) times daily as needed for cough. 04/23/15   Earley FavorGail Schulz, NP  bisacodyl (DULCOLAX) 5 MG EC tablet Take 1 tablet (5 mg total) by mouth 2 (two) times daily. Patient not taking: Reported on 05/14/2014 12/25/12   Mirian MoMatthew Gentry, MD  bisacodyl (FLEET) 10 MG/30ML ENEM Place 30 mLs (10 mg total) rectally once. Patient not taking: Reported on 05/14/2014 12/25/12   Mirian MoMatthew Gentry, MD  guaiFENesin-dextromethorphan [facility](ROBITUSSIN DM) 100-10  MG/5ML syrup Take 15 mLs by mouth every 6 (six) hours as needed for cough.    Historical Provider, MD  ibuprofen (ADVIL,MOTRIN) 200 MG tablet Take 800 mg by mouth every 8 (eight) hours as needed for moderate pain.    Historical Provider, MD  naproxen (NAPROSYN) 500 MG tablet Take 1 tablet (500 mg total) by mouth 2 (two) times daily. Patient not taking: Reported on 04/23/2015 05/14/14   Felicie Mornavid Smith, NP  naproxen sodium (ANAPROX) 220 MG tablet Take 660 mg by mouth 2 (two) times daily as needed. For pain    Historical Provider, MD  ondansetron (ZOFRAN) 4 MG tablet Take 1 tablet (4 mg total) by mouth every 6 (six) hours. Patient not taking: Reported on 05/14/2014 12/25/12   Mirian MoMatthew Gentry, MD  oseltamivir (TAMIFLU) 75 MG capsule Take 1 capsule (75 mg total) by mouth daily. 04/23/15   Earley FavorGail Schulz, NP  polyethylene glycol (GOLYTELY) 236 G solution Take 2,000 mLs by mouth once. Patient not taking: Reported on 05/14/2014 12/25/12   Mirian MoMatthew Gentry, MD    Family History No family history on file.  Social History Social History  Substance Use Topics  . Smoking status: Never Smoker  . Smokeless tobacco: Never Used  . Alcohol use No     Allergies   Patient has no known allergies.   Review of Systems Review of Systems  Constitutional: Negative for chills and fever.  Musculoskeletal: Positive for arthralgias (knee) and back pain.  Physical Exam Updated Vital Signs BP (!) 131/111 (BP Location: Left Arm)   Pulse 100   Temp 97.6 F (36.4 C) (Oral)   Resp 18   Ht 5\' 7"  (1.702 m)   Wt (!) 320 lb (145.2 kg)   LMP 02/11/2016   SpO2 100%   BMI 50.12 kg/m   Physical Exam Physical Exam  Constitutional: Obese. Pt appears well-developed and well-nourished. No distress.  HENT:  Head: Normocephalic and atraumatic.  Eyes: Conjunctivae are normal.  Neck: Normal range of motion.  Cardiovascular: Normal rate, regular rhythm and intact distal pulses.   Capillary refill < 3 sec  Pulmonary/Chest:  Effort normal and breath sounds normal.  Musculoskeletal: Pt exhibits tenderness to palpation of left knee anteriorly and ttp to mid-back musculature, no bony abnormality or deformity. Pt exhibits no edema.  ROM: 5/5  Neurological: Pt  is alert. Coordination normal.  Sensation 5/5 Strength 5/5  Skin: Skin is warm and dry. Pt is not diaphoretic.  No tenting of the skin  Psychiatric: Pt has a normal mood and affect.  Nursing note and vitals reviewed.   ED Treatments / Results  DIAGNOSTIC STUDIES: Oxygen Saturation is 100% on RA, normal by my interpretation.    COORDINATION OF CARE: 10:46 PM Discussed treatment plan with pt at bedside and pt agreed to plan.   Labs (all labs ordered are listed, but only abnormal results are displayed) Labs Reviewed - No data to display  EKG  EKG Interpretation None       Radiology Dg Knee Complete 4 Views Left  Result Date: 02/13/2016 CLINICAL DATA:  Left knee pain.  Status post fall. EXAM: LEFT KNEE - COMPLETE 4+ VIEW COMPARISON:  None. FINDINGS: There are large marginal osteophytes at the medial and lateral femorotibial articulations. There is moderate narrowing of the medial compartment. There is no acute fracture or dislocation. There is no knee effusion. No patellofemoral space narrowing. IMPRESSION: 1. No acute fracture or dislocation of the left knee. 2. 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 Electronically Signed   By: Deatra Robinson M.D.   On: 02/13/2016 23:34    Procedures Procedures (including critical care time)  Medications Ordered in ED Medications  HYDROcodone-acetaminophen (NORCO/VICODIN) 5-325 MG per tablet 2 tablet (2 tablets Oral Given 02/13/16 2250)     Initial Impression / Assessment and Plan / ED Course  I have reviewed the triage vital signs and the nursing notes.  Pertinent labs & imaging results that were available during my care of the patient were reviewed by me and considered in my medical decision  making (see chart for details).  Clinical Course     Patient X-Ray negative for obvious fracture or dislocation.  Pt advised to follow up with orthopedics. Conservative therapy recommended and discussed. Patient will be discharged home & is agreeable with above plan. Returns precautions discussed. Pt appears safe for discharge.   Final Clinical Impressions(s) / ED Diagnoses   Final diagnoses:  Fall, initial encounter  Acute bilateral thoracic back pain  Acute pain of left knee    New Prescriptions Discharge Medication List as of 02/13/2016 11:42 PM    START taking these medications   Details  HYDROcodone-acetaminophen (NORCO/VICODIN) 5-325 MG tablet Take 1-2 tablets by mouth every 6 (six) hours as needed., Starting Mon 02/13/2016, Print       I personally performed the services described in this documentation, which was scribed in my presence. The recorded information has been reviewed and is accurate.  Roxy Horsemanobert Lamaya Hyneman, PA-C 02/13/16 2359    Dione Boozeavid Glick, MD 02/14/16 [id number]

## 2016-02-14 ENCOUNTER — Encounter (HOSPITAL_COMMUNITY): Payer: Self-pay | Admitting: *Deleted

## 2016-02-14 ENCOUNTER — Emergency Department (HOSPITAL_COMMUNITY): Payer: Self-pay

## 2016-02-14 ENCOUNTER — Emergency Department (HOSPITAL_COMMUNITY)
Admission: EM | Admit: 2016-02-14 | Discharge: 2016-02-15 | Disposition: A | Discharge status: Home / Self Care | Payer: Self-pay | Attending: Emergency Medicine | Admitting: Emergency Medicine

## 2016-02-14 DIAGNOSIS — Y9389 Activity, other specified: Secondary | ICD-10-CM | POA: Insufficient documentation

## 2016-02-14 DIAGNOSIS — Y929 Unspecified place or not applicable: Secondary | ICD-10-CM | POA: Insufficient documentation

## 2016-02-14 DIAGNOSIS — Y999 Unspecified external cause status: Secondary | ICD-10-CM | POA: Insufficient documentation

## 2016-02-14 DIAGNOSIS — M25562 Pain in left knee: Secondary | ICD-10-CM | POA: Insufficient documentation

## 2016-02-14 DIAGNOSIS — W01198A Fall on same level from slipping, tripping and stumbling with subsequent striking against other object, initial encounter: Secondary | ICD-10-CM | POA: Insufficient documentation

## 2016-02-14 NOTE — ED Triage Notes (Signed)
Pt states she slipped and fell while cooking this evening. Pt complains of pain in her left knee and lower back pain. Pt denies head injury or loss of consciousness. Pt is ambulatory, states pain is worse movement.

## 2016-02-15 MED ORDER — NAPROXEN 500 MG PO TABS: 500.0000 mg | ORAL_TABLET | Freq: Two times a day (BID) | ORAL | 0 refills | Status: DC

## 2016-02-15 MED ORDER — LIDOCAINE 5 % EX PTCH: 1.0000 | MEDICATED_PATCH | CUTANEOUS | 0 refills | Status: DC

## 2016-02-15 NOTE — Discharge Instructions (Signed)
Expect your soreness to increase over the next 2-3 days. Take it easy, but do not lay around too much as this may make the stiffness worse. Take 500 mg of naproxen every 12 hours or 800 mg of ibuprofen every 8 hours for the next 3 days. Take these medications with food to avoid upset stomach. Be sure to perform the attached exercises starting with three times a week and working up to performing them daily. This is an essential part of preventing long term problems.   Follow-up with the orthopedic surgeon for further management of this issue.

## 2016-02-15 NOTE — ED Provider Notes (Signed)
[facility]-EMERGENCY DEPT Provider Note   CSN: [id number] Arrival date & time: 02/14/16  2200     History   Chief Complaint Chief Complaint  Patient presents with  . Back Pain  . Knee Pain  . Fall    HPI Tina Acosta is a 50 y.o. female.  HPI   Tina Acosta is a 50 y.o. female, with a history of obesity, presenting to the ED with Right lower back pain and left knee pain beginning this evening. Patient states that she slipped while preparing dinner, striking her lower back against her deep freezer. Patient then went down to her knees. Patient states she was immediately ambulatory following the incident. Pain is mild to moderate, aching, nonradiating.  Patient has not taken any medications for this issue. Denies neuro deficits, head injury, LOC, or any other complaints.   History reviewed. No pertinent past medical history.  Patient Active Problem List   Diagnosis Date Noted  . Weight gain 12/12/2012  . Elevated BP 12/12/2012  . Acne 12/12/2012  . Odontalgia 12/12/2012  . Periodic health assessment, general screening, adult 12/12/2012    Past Surgical History:  Procedure Laterality Date  . GALLBLADDER SURGERY    . TUBAL LIGATION      OB History    No data available       Home Medications    Prior to Admission medications   Medication Sig Start Date End Date Taking? Authorizing Provider  benzonatate (TESSALON) 200 MG capsule Take 1 capsule (200 mg total) by mouth 3 (three) times daily as needed for cough. 04/23/15   Earley FavorGail Schulz, NP  bisacodyl (DULCOLAX) 5 MG EC tablet Take 1 tablet (5 mg total) by mouth 2 (two) times daily. Patient not taking: Reported on 05/14/2014 12/25/12   Mirian MoMatthew Gentry, MD  bisacodyl (FLEET) 10 MG/30ML ENEM Place 30 mLs (10 mg total) rectally once. Patient not taking: Reported on 05/14/2014 12/25/12   Mirian MoMatthew Gentry, MD  guaiFENesin-dextromethorphan [facility](ROBITUSSIN DM) 100-10 MG/5ML syrup Take 15 mLs by mouth every 6 (six) hours as needed for  cough.    Historical Provider, MD  HYDROcodone-acetaminophen (NORCO/VICODIN) 5-325 MG tablet Take 1-2 tablets by mouth every 6 (six) hours as needed. 02/13/16   Roxy Horsemanobert Browning, PA-C  ibuprofen (ADVIL,MOTRIN) 200 MG tablet Take 800 mg by mouth every 8 (eight) hours as needed for moderate pain.    Historical Provider, MD  lidocaine (LIDODERM) 5 % Place 1 patch onto the skin daily. Remove & Discard patch within 12 hours or as directed by MD 02/15/16   Hillard DankerShawn C Jaquon Gingerich, PA-C  naproxen (NAPROSYN) 500 MG tablet Take 1 tablet (500 mg total) by mouth 2 (two) times daily. Patient not taking: Reported on 04/23/2015 05/14/14   Felicie Mornavid Smith, NP  naproxen (NAPROSYN) 500 MG tablet Take 1 tablet (500 mg total) by mouth 2 (two) times daily. 02/15/16   Harvin Konicek C Woodrow Drab, PA-C  naproxen sodium (ANAPROX) 220 MG tablet Take 660 mg by mouth 2 (two) times daily as needed. For pain    Historical Provider, MD  ondansetron (ZOFRAN) 4 MG tablet Take 1 tablet (4 mg total) by mouth every 6 (six) hours. Patient not taking: Reported on 05/14/2014 12/25/12   Mirian MoMatthew Gentry, MD  oseltamivir (TAMIFLU) 75 MG capsule Take 1 capsule (75 mg total) by mouth daily. 04/23/15   Earley FavorGail Schulz, NP  polyethylene glycol (GOLYTELY) 236 G solution Take 2,000 mLs by mouth once. Patient not taking: Reported on 05/14/2014 12/25/12   Mirian MoMatthew Gentry, MD  Family History No family history on file.  Social History Social History  Substance Use Topics  . Smoking status: Never Smoker  . Smokeless tobacco: Never Used  . Alcohol use No     Allergies   Patient has no known allergies.   Review of Systems Review of Systems  Gastrointestinal: Negative for nausea and vomiting.  Musculoskeletal: Positive for arthralgias and back pain. Negative for neck pain.  Neurological: Negative for dizziness, syncope, weakness, light-headedness, numbness and headaches.  All other systems reviewed and are negative.    Physical Exam Updated Vital Signs BP 134/75 (BP  Location: Left Arm)   Pulse 88   Temp 98.3 F (36.8 C) (Oral)   Resp 18   Ht 5\' 7"  (1.702 m)   Wt (!) 147.4 kg   LMP 02/11/2016 Comment: verified BEFORE imaging  SpO2 99%   BMI 50.90 kg/m   Physical Exam  Constitutional: She appears well-developed and well-nourished. No distress.  HENT:  Head: Normocephalic and atraumatic.  Eyes: Conjunctivae are normal.  Neck: Neck supple.  Cardiovascular: Normal rate, regular rhythm and intact distal pulses.   Pulmonary/Chest: Effort normal. No respiratory distress.  Abdominal: Soft. There is no tenderness. There is no guarding.  Musculoskeletal: She exhibits no edema.  Tenderness to the anterior left knee. No noted swelling, effusion, laxity, or deformity. Tenderness to the right lumbar musculature. Normal motor function intact in all extremities and spine. No midline spinal tenderness.   Neurological: She is alert.  No sensory deficits. Strength 5/5 in all extremities. No gait disturbance. Coordination intact.   Skin: Skin is warm and dry. She is not diaphoretic.  Psychiatric: She has a normal mood and affect. Her behavior is normal.  Nursing note and vitals reviewed.    ED Treatments / Results  Labs (all labs ordered are listed, but only abnormal results are displayed) Labs Reviewed - No data to display  EKG  EKG Interpretation None       Radiology Dg Knee Complete 4 Views Left  Result Date: 02/14/2016 CLINICAL DATA:  Injury to left knee, with popping sensation. Posterior and lateral left knee pain. Initial encounter. EXAM: LEFT KNEE - COMPLETE 4+ VIEW COMPARISON:  Left knee radiographs performed 02/13/2016 FINDINGS: There is no evidence of fracture or dislocation. There is narrowing of the medial compartment. Marginal osteophytes are seen arising at all 3 compartments. An apparent loose body is noted overlying the lateral compartment. No significant joint effusion is seen. The visualized soft tissues are normal in appearance.  IMPRESSION: 1. No evidence of fracture or dislocation. 2. Tricompartmental osteoarthritis, with narrowing of the medial compartment. Electronically Signed   By: Roanna Raider M.D.   On: 02/14/2016 23:10   Dg Knee Complete 4 Views Left  Result Date: 02/13/2016 CLINICAL DATA:  Left knee pain.  Status post fall. EXAM: LEFT KNEE - COMPLETE 4+ VIEW COMPARISON:  None. FINDINGS: There are large marginal osteophytes at the medial and lateral femorotibial articulations. There is moderate narrowing of the medial compartment. There is no acute fracture or dislocation. There is no knee effusion. No patellofemoral space narrowing. IMPRESSION: 1. No acute fracture or dislocation of the left knee. 2. 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 Electronically Signed   By: Deatra Robinson M.D.   On: 02/13/2016 23:34    Procedures Procedures (including critical care time)  Medications Ordered in ED Medications - No data to display   Initial Impression / Assessment and Plan / ED Course  I have reviewed the triage vital  signs and the nursing notes.  Pertinent labs & imaging results that were available during my care of the patient were reviewed by me and considered in my medical decision making (see chart for details).  Clinical Course      Patient presents for evaluation following a fall. No neuro or functional deficits. No acute abnormalities on x-ray. Orthopedic follow-up. Return precautions discussed.     Final Clinical Impressions(s) / ED Diagnoses   Final diagnoses:  Acute pain of left knee    New Prescriptions Discharge Medication List as of 02/15/2016 12:13 AM    START taking these medications   Details  lidocaine (LIDODERM) 5 % Place 1 patch onto the skin daily. Remove & Discard patch within 12 hours or as directed by MD, Starting Wed 02/15/2016, Print    !! naproxen (NAPROSYN) 500 MG tablet Take 1 tablet (500 mg total) by mouth 2 (two) times daily., Starting Wed 02/15/2016, Print       !! - Potential duplicate medications found. Please discuss with provider.       Anselm PancoastShawn C Cedricka Sackrider, PA-C 02/15/16 [id number]    Tomasita CrumbleAdeleke Oni, MD 02/15/16 0700

## 2016-06-12 ENCOUNTER — Encounter (INDEPENDENT_AMBULATORY_CARE_PROVIDER_SITE_OTHER): Payer: Self-pay | Admitting: Podiatry

## 2016-06-12 NOTE — Progress Notes (Signed)
This encounter was created in error - please disregard.

## 2017-07-28 ENCOUNTER — Other Ambulatory Visit: Payer: Self-pay

## 2017-07-28 ENCOUNTER — Encounter (HOSPITAL_COMMUNITY): Payer: Self-pay | Admitting: *Deleted

## 2017-07-28 ENCOUNTER — Emergency Department (HOSPITAL_COMMUNITY): Payer: Managed Care, Other (non HMO)

## 2017-07-28 ENCOUNTER — Emergency Department (HOSPITAL_COMMUNITY)
Admission: EM | Admit: 2017-07-28 | Discharge: 2017-07-29 | Disposition: A | Discharge status: Home / Self Care | Payer: Managed Care, Other (non HMO) | Attending: Emergency Medicine | Admitting: Emergency Medicine

## 2017-07-28 DIAGNOSIS — R0789 Other chest pain: Secondary | ICD-10-CM

## 2017-07-28 LAB — CBC
HEMATOCRIT: 33.8 % — AB (ref 36.0–46.0)
Hemoglobin: 10.9 g/dL — ABNORMAL LOW (ref 12.0–15.0)
MCH: 27 pg (ref 26.0–34.0)
MCHC: 32.2 g/dL (ref 30.0–36.0)
MCV: 83.7 fL (ref 78.0–100.0)
PLATELETS: 326 10*3/uL (ref 150–400)
RBC: 4.04 MIL/uL (ref 3.87–5.11)
RDW: 14.6 % (ref 11.5–15.5)
WBC: 7.9 10*3/uL (ref 4.0–10.5)

## 2017-07-28 LAB — BASIC METABOLIC PANEL
Anion gap: 7 (ref 5–15)
BUN: 14 mg/dL (ref 6–20)
CALCIUM: 8.4 mg/dL — AB (ref 8.9–10.3)
CO2: 25 mmol/L (ref 22–32)
Chloride: 105 mmol/L (ref 101–111)
Creatinine, Ser: 0.75 mg/dL (ref 0.44–1.00)
GFR calc Af Amer: >60 mL/min (ref >60)
GLUCOSE: 126 mg/dL — AB (ref 65–99)
POTASSIUM: 3.7 mmol/L (ref 3.5–5.1)
Sodium: 137 mmol/L (ref 135–145)

## 2017-07-28 LAB — I-STAT TROPONIN, ED: Troponin i, poc: 0 ng/mL (ref 0.00–0.08)

## 2017-07-28 LAB — I-STAT BETA HCG BLOOD, ED (MC, WL, AP ONLY): I-stat hCG, quantitative: <5 m[IU]/mL (ref <5)

## 2017-07-28 LAB — D-DIMER, QUANTITATIVE: D-Dimer, Quant: 0.55 ug/mL-FEU — ABNORMAL HIGH (ref 0.00–0.50)

## 2017-07-28 MED ORDER — OXYCODONE-ACETAMINOPHEN 5-325 MG PO TABS
1.0000 | ORAL_TABLET | Freq: Once | ORAL | Status: AC
  Administered 2017-07-28: 1 via ORAL
  Filled 2017-07-28: qty 1

## 2017-07-28 MED ORDER — KETOROLAC TROMETHAMINE 30 MG/ML IJ SOLN
30.0000 mg | Freq: Once | INTRAMUSCULAR | Status: AC
  Administered 2017-07-28: 30 mg via INTRAVENOUS
  Filled 2017-07-28: qty 1

## 2017-07-28 NOTE — ED Triage Notes (Signed)
Pt brought in by [facility] for c/o right side chest pain that radiates to her shoulder and pt states it hurts to take a deep breath that started around an hour ago; pt has had  of asa and 1 nitro with no relief; pt was watching tv when the pain started; pt has been diaphoreti; last nitro at 2137 with ems

## 2017-07-28 NOTE — ED Provider Notes (Signed)
[facility] EMERGENCY DEPARTMENT Provider Note   CSN: [id number] Arrival date & time: 07/28/17  2152     History   Chief Complaint Chief Complaint  Patient presents with  . Chest Pain    HPI Tina Acosta is a 52 y.o. female.  Patient presents to the ER for evaluation of chest pain.  Patient reports sudden onset of a sharp stabbing pain in the area of her right axilla that began approximately an hour before coming to the ER.  Patient reports that the pain is constant, but worsens if she moves, touches the area or takes a deep breath.  She does not feel short of breath.  She has not had any cough or chest congestion.  Patient denies story of blood clot, unilateral leg swelling, recent travel.     History reviewed. No pertinent past medical history.  Patient Active Problem List   Diagnosis Date Noted  . Weight gain 12/12/2012  . Elevated BP 12/12/2012  . Acne 12/12/2012  . Odontalgia 12/12/2012  . Periodic health assessment, general screening, adult 12/12/2012    Past Surgical History:  Procedure Laterality Date  . GALLBLADDER SURGERY    . KNEE SURGERY Right   . TUBAL LIGATION       OB History   None      Home Medications    Prior to Admission medications   Medication Sig Start Date End Date Taking? Authorizing Provider  ibuprofen (ADVIL,MOTRIN) 200 MG tablet Take 800 mg by mouth every 8 (eight) hours as needed for moderate pain.   Yes [provider]  cyclobenzaprine (FLEXERIL) 10 MG tablet Take 1 tablet (10 mg total) by mouth 3 (three) times daily as needed for muscle spasms. 07/29/17   Gilda Crease, MD  guaiFENesin-dextromethorphan (ROBITUSSIN DM) 100-10 MG/5ML syrup Take 15 mLs by mouth every 6 (six) hours as needed for cough.    [provider]  ibuprofen (ADVIL,MOTRIN) 800 MG tablet Take 1 tablet (800 mg total) by mouth every 8 (eight) hours as needed for moderate pain. 07/29/17   Gilda Crease, MD    Family  History History reviewed. No pertinent family history.  Social History Social History   Tobacco Use  . Smoking status: Never Smoker  . Smokeless tobacco: Never Used  Substance Use Topics  . Alcohol use: No  . Drug use: No     Allergies   Patient has no known allergies.   Review of Systems Review of Systems  Respiratory: Negative for shortness of breath.   Cardiovascular: Positive for chest pain.  All other systems reviewed and are negative.    Physical Exam Updated Vital Signs BP 139/80 (BP Location: Left Wrist)   Pulse (!) 107   Temp 97.8 F (36.6 C) (Oral)   Resp (!) 22   Ht  (1.676 m)   Wt (!) 147.4 kg (325 lb)   LMP 07/08/2017   SpO2 100%   BMI 52.46 kg/m   Physical Exam  Constitutional: She is oriented to person, place, and time. She appears well-developed and well-nourished. No distress.  HENT:  Head: Normocephalic and atraumatic.  Right Ear: Hearing normal.  Left Ear: Hearing normal.  Nose: Nose normal.  Mouth/Throat: Oropharynx is clear and moist and mucous membranes are normal.  Eyes: Pupils are equal, round, and reactive to light. Conjunctivae and EOM are normal.  Neck: Normal range of motion. Neck supple.  Cardiovascular: Regular rhythm, S1 normal and S2 normal. Exam reveals no gallop and no  friction rub.  No murmur heard. Pulmonary/Chest: Effort normal and breath sounds normal. No respiratory distress. She exhibits tenderness.    Abdominal: Soft. Normal appearance and bowel sounds are normal. There is no hepatosplenomegaly. There is no tenderness. There is no rebound, no guarding, no tenderness at McBurney's point and negative Murphy's sign. No hernia.  Musculoskeletal: Normal range of motion.  Neurological: She is alert and oriented to person, place, and time. She has normal strength. No cranial nerve deficit or sensory deficit. Coordination normal. GCS eye subscore is 4. GCS verbal subscore is 5. GCS motor subscore is 6.  Skin: Skin is  warm, dry and intact. No rash noted. No cyanosis.  Psychiatric: She has a normal mood and affect. Her speech is normal and behavior is normal. Thought content normal.  Nursing note and vitals reviewed.    ED Treatments / Results  Labs (all labs ordered are listed, but only abnormal results are displayed) Labs Reviewed  BASIC METABOLIC PANEL - Abnormal; Notable for the following components:      Result Value   Glucose, Bld 126 (*)    Calcium 8.4 (*)    All other components within normal limits  CBC - Abnormal; Notable for the following components:   Hemoglobin 10.9 (*)    HCT 33.8 (*)    All other components within normal limits  D-DIMER, QUANTITATIVE (NOT AT [facility] - Abnormal; Notable for the following components:   D-Dimer, Quant 0.55 (*)    All other components within normal limits  I-STAT TROPONIN, ED  I-STAT BETA HCG BLOOD, ED ([facility], [facility], [facility] ONLY)    EKG EKG Interpretation  Date/Time:  Sunday Jul 28 2017 22:08:34 EDT Ventricular Rate:  101 PR Interval:    QRS Duration: 77 QT Interval:  401 QTC Calculation: 520 R Axis:   62 Text Interpretation:  Sinus tachycardia RSR' in V1 or V2, probably normal variant Borderline T abnormalities, diffuse leads Prolonged QT interval No significant change since last tracing Confirmed by Gilda Crease [id number]) on 07/28/2017 11:50:22 PM   Radiology Dg Chest 2 View  Result Date: 07/28/2017 CLINICAL DATA:  52 year old female with chest pain. EXAM: CHEST - 2 VIEW COMPARISON:  Chest radiograph dated 04/23/2015 FINDINGS: There is mild bronchitic changes. No focal consolidation, pleural effusion, or pneumothorax. The cardiac silhouette is within normal limits. No acute osseous pathology. IMPRESSION: No focal consolidation. Findings may represent reactive small airway disease. Clinical correlation is recommended. Electronically Signed   By: Elgie Collard M.D.   On: 07/28/2017 23:47   Ct Angio Chest Pe W Or Wo Contrast  Result Date:  07/29/2017 CLINICAL DATA:  52 y/o  F; chest pain with painful respiration. EXAM: CT ANGIOGRAPHY CHEST WITH CONTRAST TECHNIQUE: Multidetector CT imaging of the chest was performed using the standard protocol during bolus administration of intravenous contrast. Multiplanar CT image reconstructions and MIPs were obtained to evaluate the vascular anatomy. CONTRAST:  100 cc Isovue 370 COMPARISON:  09/03/2011 CT angiogram chest FINDINGS: Cardiovascular: Mild respiratory motion artifact. Satisfactory opacification of the pulmonary arteries to the segmental level. No evidence of pulmonary embolism. Normal heart size. No pericardial effusion. Mediastinum/Nodes: No enlarged mediastinal, hilar, or axillary lymph nodes. Thyroid gland, trachea, and esophagus demonstrate no significant findings. Lungs/Pleura: Lungs are clear. No pleural effusion or pneumothorax. Upper Abdomen: No acute abnormality. Musculoskeletal: No chest wall abnormality. No acute or significant osseous findings. Review of the MIP images confirms the above findings. IMPRESSION: 1. Mild respiratory motion artifact. No pulmonary embolus identified. 2.  Stable unremarkable CT angiogram of the chest. Electronically Signed   By: Mitzi Hansen M.D.   On: 07/29/2017 01:46    Procedures Procedures (including critical care time)  Medications Ordered in ED Medications  ketorolac (TORADOL) 30 MG/ML injection 30 mg (30 mg Intravenous Given 07/28/17 2348)  oxyCODONE-acetaminophen (PERCOCET/ROXICET) 5-325 MG per tablet 1 tablet (1 tablet Oral Given 07/28/17 2347)  iopamidol (ISOVUE-370) 76 % injection 80 mL ( Intravenous Contrast Given 07/29/17 0108)     Initial Impression / Assessment and Plan / ED Course  I have reviewed the triage vital signs and the nursing notes.  Pertinent labs & imaging results that were available during my care of the patient were reviewed by me and considered in my medical decision making (see chart for details).     Patient  presents to the ER for evaluation of right-sided chest pain.  Pain is in the area of the right axilla.  She reports sudden onset of the pain while sitting at home.  She has minimal cardiac risk factors.  Symptoms do not seem to be consistent with cardiac etiology.  She did, however, have mild tachycardia and pleuritic pain, PE was considered.  She has significant reproducibility, however, it is felt that this is likely chest wall pain.  Her d-dimer, however, was elevated.  She therefore underwent CT angiography.  This did not show any evidence of PE.  Patient feeling better after Percocet and Toradol.  Patient reassured, will be discharged.  No further work-up necessary, will treat for chest wall pain.  Final Clinical Impressions(s) / ED Diagnoses   Final diagnoses:  Chest wall pain    ED Discharge Orders        Ordered    cyclobenzaprine (FLEXERIL) 10 MG tablet  3 times daily PRN     07/29/17 0158    ibuprofen (ADVIL,MOTRIN) 800 MG tablet  Every 8 hours PRN     07/29/17 0158       Gilda Crease, MD 07/29/17 [phone]

## 2017-07-28 NOTE — ED Notes (Signed)
POC Troponin: 0.00 POC CBG: <5

## 2017-07-29 ENCOUNTER — Emergency Department (HOSPITAL_COMMUNITY): Payer: Managed Care, Other (non HMO)

## 2017-07-29 MED ORDER — CYCLOBENZAPRINE HCL 10 MG PO TABS: 10.0000 mg | ORAL_TABLET | Freq: Three times a day (TID) | ORAL | 0 refills | Status: AC | PRN

## 2017-07-29 MED ORDER — IBUPROFEN 800 MG PO TABS: 800.0000 mg | ORAL_TABLET | Freq: Three times a day (TID) | ORAL | 0 refills | Status: AC | PRN

## 2017-07-29 MED ORDER — IOPAMIDOL (ISOVUE-370) INJECTION 76%
80.0000 mL | Freq: Once | INTRAVENOUS | Status: AC | PRN
  Administered 2017-07-29: 01:00:00 via INTRAVENOUS

## 2018-02-25 ENCOUNTER — Emergency Department (HOSPITAL_COMMUNITY): Payer: Managed Care, Other (non HMO)

## 2018-02-25 ENCOUNTER — Emergency Department (HOSPITAL_COMMUNITY)
Admission: EM | Admit: 2018-02-25 | Discharge: 2018-02-25 | Disposition: A | Discharge status: Home / Self Care | Payer: Managed Care, Other (non HMO) | Attending: Emergency Medicine | Admitting: Emergency Medicine

## 2018-02-25 ENCOUNTER — Other Ambulatory Visit: Payer: Self-pay

## 2018-02-25 ENCOUNTER — Encounter (HOSPITAL_COMMUNITY): Payer: Self-pay | Admitting: Emergency Medicine

## 2018-02-25 DIAGNOSIS — B9789 Other viral agents as the cause of diseases classified elsewhere: Secondary | ICD-10-CM

## 2018-02-25 DIAGNOSIS — J069 Acute upper respiratory infection, unspecified: Secondary | ICD-10-CM

## 2018-02-25 DIAGNOSIS — R0981 Nasal congestion: Secondary | ICD-10-CM | POA: Diagnosis present

## 2018-02-25 LAB — GROUP A STREP BY PCR: GROUP A STREP BY PCR: NOT DETECTED

## 2018-02-25 MED ORDER — FLUTICASONE PROPIONATE 50 MCG/ACT NA SUSP: 1.0000 | Freq: Every day | NASAL | 0 refills | Status: AC

## 2018-02-25 MED ORDER — BENZONATATE 100 MG PO CAPS: 100.0000 mg | ORAL_CAPSULE | Freq: Three times a day (TID) | ORAL | 0 refills | Status: AC | PRN

## 2018-02-25 MED ORDER — NAPROXEN 500 MG PO TABS: 500.0000 mg | ORAL_TABLET | Freq: Two times a day (BID) | ORAL | 0 refills | Status: AC

## 2018-02-25 NOTE — Discharge Instructions (Addendum)
You were seen in the emergency today for upper respiratory symptoms, we suspect your symptoms are related to a virus at this time.  Your strep test was negative. Your chest xray was normal. I have prescribed you multiple medications to treat your symptoms.   -Flonase to be used 1 spray in each nostril daily.  This medication is used to treat your congestion.  -Tessalon can be taken once every 8 hours as needed.  This medication is used to treat your cough.  -- Naproxen is a nonsteroidal anti-inflammatory medication that will help with pain and swelling. Be sure to take this medication as prescribed with food, 1 pill every 12 hours,  It should be taken with food, as it can cause stomach upset, and more seriously, stomach bleeding. Do not take other nonsteroidal anti-inflammatory medications with this such as Advil, Motrin, Aleve, Mobic, Goodie Powder, or Motrin.    You make take Tylenol per over the counter dosing with these medications.   We have prescribed you new medication(s) today. Discuss the medications prescribed today with your pharmacist as they can have adverse effects and interactions with your other medicines including over the counter and prescribed medications. Seek medical evaluation if you start to experience new or abnormal symptoms after taking one of these medicines, seek care immediately if you start to experience difficulty breathing, feeling of your throat closing, facial swelling, or rash as these could be indications of a more serious allergic reaction  You will need to follow-up with your primary care provider in 1 week if your symptoms have not improved.  If you do not have a primary care provider one is provided in your discharge instructions.  Return to the emergency department for any new or worsening symptoms including but not limited to persistent fever for 5 days, difficulty breathing, chest pain, rashes, passing out, or any other concerns.

## 2018-02-25 NOTE — ED Triage Notes (Signed)
Onset 2-3 days ago developed dry cough, sore throat, and body achy. Airway intact bilateral equal rise and fall.

## 2018-02-25 NOTE — ED Provider Notes (Signed)
[facility] EMERGENCY DEPARTMENT Provider Note   CSN: [id number] Arrival date & time: 02/25/18  4098     History   Chief Complaint Chief Complaint  Patient presents with  . Cough  . Otalgia  . Sore Throat    HPI Tina Acosta is a 52 y.o. female who is s/p tubal ligation who presents to the ED with complaint of URI sxs for the past 5 days. Patient reports congestion, bilateral ear pain, sore throat, cough that is productive of mucous sputum, generalized aches, and chest pain specific to coughing otherwise none. Sxs are constant. Tried warm tea w/ honey and OTC medicines without relief. Denies fever, chills, vomiting, diarrhea, abdominal pain, or dyspnea.   HPI  History reviewed. No pertinent past medical history.  Patient Active Problem List   Diagnosis Date Noted  . Weight gain 12/12/2012  . Elevated BP 12/12/2012  . Acne 12/12/2012  . Odontalgia 12/12/2012  . Periodic health assessment, general screening, adult 12/12/2012    Past Surgical History:  Procedure Laterality Date  . GALLBLADDER SURGERY    . KNEE SURGERY Right   . TUBAL LIGATION       OB History   None      Home Medications    Prior to Admission medications   Medication Sig Start Date End Date Taking? Authorizing Provider  cyclobenzaprine (FLEXERIL) 10 MG tablet Take 1 tablet (10 mg total) by mouth 3 (three) times daily as needed for muscle spasms. 07/29/17   Gilda Crease, MD  guaiFENesin-dextromethorphan (ROBITUSSIN DM) 100-10 MG/5ML syrup Take 15 mLs by mouth every 6 (six) hours as needed for cough.    [provider]  ibuprofen (ADVIL,MOTRIN) 200 MG tablet Take 800 mg by mouth every 8 (eight) hours as needed for moderate pain.    [provider]  ibuprofen (ADVIL,MOTRIN) 800 MG tablet Take 1 tablet (800 mg total) by mouth every 8 (eight) hours as needed for moderate pain. 07/29/17   Gilda Crease, MD    Family History No family history on  file.  Social History Social History   Tobacco Use  . Smoking status: Never Smoker  . Smokeless tobacco: Never Used  Substance Use Topics  . Alcohol use: No  . Drug use: No     Allergies   Patient has no known allergies.   Review of Systems Review of Systems  Constitutional: Negative for chills and fever.  HENT: Positive for congestion, ear pain and sore throat. Negative for trouble swallowing and voice change.   Respiratory: Positive for cough. Negative for shortness of breath.   Cardiovascular: Positive for chest pain (w/ coughing).  Gastrointestinal: Negative for abdominal pain, constipation, diarrhea, nausea and vomiting.  Musculoskeletal: Positive for myalgias (generalized).  Neurological: Negative for weakness and numbness.     Physical Exam Updated Vital Signs BP 127/88 (BP Location: Right Wrist)   Pulse 92   Temp 98.6 F (37 C) (Oral)   Resp 18   Ht 5\' 6"  (1.676 m)   Wt (!) 145.6 kg   SpO2 97%   BMI 51.81 kg/m   Physical Exam  Constitutional: She appears well-developed and well-nourished. No distress.  HENT:  Head: Normocephalic and atraumatic.  Right Ear: External ear and ear canal normal. Tympanic membrane is not perforated, not erythematous, not retracted and not bulging. A middle ear effusion is present.  Left Ear: External ear and ear canal normal. Tympanic membrane is not perforated, not erythematous, not retracted and not bulging.  A middle ear effusion is present.  Nose: Nose normal. Right sinus exhibits no maxillary sinus tenderness and no frontal sinus tenderness. Left sinus exhibits no maxillary sinus tenderness and no frontal sinus tenderness.  Mouth/Throat: Uvula is midline. Posterior oropharyngeal erythema present. No oropharyngeal exudate.  Posterior oropharynx is symmetric appearing. Patient tolerating own secretions without difficulty. No trismus. No drooling. No hot potato voice. No swelling beneath the tongue, submandibular compartment is  soft.   Eyes: Pupils are equal, round, and reactive to light. Conjunctivae are normal. Right eye exhibits no discharge. Left eye exhibits no discharge.  Neck: Normal range of motion. Neck supple. No neck rigidity. No edema and no erythema present.  Cardiovascular: Normal rate and regular rhythm.  No murmur heard. Pulmonary/Chest: Breath sounds normal. No respiratory distress. She has no wheezes. She has no rales. She exhibits tenderness (left anterior chest wall without overlying skin changes or palpable deformity/crepitus. ).  Abdominal: Soft. She exhibits no distension. There is no tenderness.  Lymphadenopathy:    She has cervical adenopathy (mild).  Neurological: She is alert.  Skin: Skin is warm and dry. Capillary refill takes less than 2 seconds. No rash noted.  Psychiatric: She has a normal mood and affect. Her behavior is normal.  Nursing note and vitals reviewed.   ED Treatments / Results  Labs (all labs ordered are listed, but only abnormal results are displayed) Labs Reviewed  GROUP A STREP BY PCR    EKG None  Radiology Dg Chest 2 View  Result Date: 02/25/2018 CLINICAL DATA:  Dry cough, chest pain, chills and shortness of breath for 1 week. EXAM: CHEST - 2 VIEW COMPARISON:  Chest x-ray 07/28/2017 and chest CT 07/29/2017. FINDINGS: The cardiac silhouette, mediastinal and hilar contours are within normal limits and stable. The lungs are clear of an acute process. No worrisome pulmonary lesions. No pleural effusion. Stable mild eventration of the right hemidiaphragm. The bony thorax is intact. IMPRESSION: No acute cardiopulmonary findings. Electronically Signed   By: Rudie Meyer M.D.   On: 02/25/2018 11:17    Procedures Procedures (including critical care time)  Medications Ordered in ED Medications - No data to display   Initial Impression / Assessment and Plan / ED Course  I have reviewed the triage vital signs and the nursing notes.  Pertinent labs & imaging  results that were available during my care of the patient were reviewed by me and considered in my medical decision making (see chart for details).    Patient presents with URI type symptoms.  Patient is nontoxic appearing, in no apparent distress, vitals are WNL. Patient is afebrile in the ED, lungs are CTA, CXR  negative for infiltrate, doubt pneumonia. There is no wheezing or signs of respiratory distress. Sxs onset < 7 days, afebrile, doubt acute bacterial sinusitis. Strep negative, exam not consistent with RPA/PTA. No evidence of AOM on exam. No meningeal signs. Suspect viral etiology at this time and will treat supportively with Naproxen, Flonase, and Tessalon. I discussed results, treatment plan, need for PCP follow-up, and return precautions with the patient. Provided opportunity for questions, patient confirmed understanding and is in agreement with plan.   Final Clinical Impressions(s) / ED Diagnoses   Final diagnoses:  Viral URI with cough    ED Discharge Orders         Ordered    naproxen (NAPROSYN) 500 MG tablet  2 times daily     02/25/18 1204    fluticasone (FLONASE) 50 MCG/ACT nasal spray  Daily     02/25/18 1204    benzonatate (TESSALON) 100 MG capsule  3 times daily PRN     02/25/18 1204           Audelia Knape, [place] R, PA-C 02/25/18 1207    Virgina NorfolkCuratolo, Adam, DO 02/25/18 1828

## 2018-08-19 ENCOUNTER — Other Ambulatory Visit: Payer: Self-pay

## 2018-08-19 ENCOUNTER — Emergency Department (HOSPITAL_COMMUNITY)
Admission: EM | Admit: 2018-08-19 | Discharge: 2018-08-19 | Disposition: A | Discharge status: Home / Self Care | Payer: Managed Care, Other (non HMO) | Attending: Emergency Medicine | Admitting: Emergency Medicine

## 2018-08-19 DIAGNOSIS — G501 Atypical facial pain: Secondary | ICD-10-CM | POA: Diagnosis present

## 2018-08-19 DIAGNOSIS — Z79899 Other long term (current) drug therapy: Secondary | ICD-10-CM | POA: Insufficient documentation

## 2018-08-19 DIAGNOSIS — J0100 Acute maxillary sinusitis, unspecified: Secondary | ICD-10-CM | POA: Insufficient documentation

## 2018-08-19 MED ORDER — AMOXICILLIN-POT CLAVULANATE 875-125 MG PO TABS: 1.0000 | ORAL_TABLET | Freq: Two times a day (BID) | ORAL | 0 refills | Status: AC

## 2018-08-19 NOTE — ED Provider Notes (Signed)
[facility] EMERGENCY DEPARTMENT Provider Note   CSN: [id number] Arrival date & time: 08/19/18  1408    History   Chief Complaint Chief Complaint  Patient presents with  . Facial Pain    HPI Tina Acosta is a 53 y.o. female presenting to emergency department today with chief complaint of sinus pain x3 days.  Patient has sinus congestion with yellow mucus.  Also has sinus tenderness that is constant.  She has associated pain in her left ear.  She states she has had ear infections in the past and this feels similar.  She has been taking Tylenol and ibuprofen without symptom relief.  She denies any fever, chills, cough, sore throat, shortness of breath, nausea, vomiting, abdominal pain, diarrhea.    History provided by patient with additional history obtained from chart review.    No past medical history on file.  Patient Active Problem List   Diagnosis Date Noted  . Weight gain 12/12/2012  . Elevated BP 12/12/2012  . Acne 12/12/2012  . Odontalgia 12/12/2012  . Periodic health assessment, general screening, adult 12/12/2012    Past Surgical History:  Procedure Laterality Date  . GALLBLADDER SURGERY    . KNEE SURGERY Right   . TUBAL LIGATION       OB History   No obstetric history on file.      Home Medications    Prior to Admission medications   Medication Sig Start Date End Date Taking? Authorizing Provider  amoxicillin-clavulanate (AUGMENTIN) 875-125 MG tablet Take 1 tablet by mouth every 12 (twelve) hours. 08/19/18   ,  E, PA-C  benzonatate (TESSALON) 100 MG capsule Take 1 capsule (100 mg total) by mouth 3 (three) times daily as needed for cough. 02/25/18   Petrucelli, Samantha R, PA-C  cyclobenzaprine (FLEXERIL) 10 MG tablet Take 1 tablet (10 mg total) by mouth 3 (three) times daily as needed for muscle spasms. 07/29/17   Gilda Crease, MD  fluticasone (FLONASE) 50 MCG/ACT nasal spray Place 1 spray into both nostrils daily.  02/25/18   Petrucelli, Samantha R, PA-C  guaiFENesin-dextromethorphan (ROBITUSSIN DM) 100-10 MG/5ML syrup Take 15 mLs by mouth every 6 (six) hours as needed for cough.    [provider]  ibuprofen (ADVIL,MOTRIN) 200 MG tablet Take 800 mg by mouth every 8 (eight) hours as needed for moderate pain.    [provider]  ibuprofen (ADVIL,MOTRIN) 800 MG tablet Take 1 tablet (800 mg total) by mouth every 8 (eight) hours as needed for moderate pain. 07/29/17   Gilda Crease, MD  naproxen (NAPROSYN) 500 MG tablet Take 1 tablet (500 mg total) by mouth 2 (two) times daily. 02/25/18   Petrucelli, Pleas Koch, PA-C    Family History No family history on file.  Social History Social History   Tobacco Use  . Smoking status: Never Smoker  . Smokeless tobacco: Never Used  Substance Use Topics  . Alcohol use: No  . Drug use: No     Allergies   Patient has no known allergies.   Review of Systems Review of Systems  Constitutional: Negative for chills and fever.  HENT: Positive for ear pain, sinus pressure and sinus pain. Negative for congestion, dental problem, ear discharge and sore throat.   Eyes: Negative for pain and redness.  Respiratory: Negative for cough and shortness of breath.   Cardiovascular: Negative for chest pain.  Gastrointestinal: Negative for abdominal pain, constipation, diarrhea, nausea and vomiting.  Genitourinary: Negative for dysuria and  hematuria.  Musculoskeletal: Negative for back pain and neck pain.  Skin: Negative for wound.  Allergic/Immunologic: Negative for immunocompromised state.  Neurological: Negative for weakness, numbness and headaches.     Physical Exam Updated Vital Signs BP (!) 137/105 (BP Location: Right Arm)   Pulse 81   Temp 98.1 F (36.7 C) (Oral)   Resp 17   SpO2 98%   Physical Exam Vitals signs and nursing note reviewed.  Constitutional:      Appearance: She is well-developed. She is not ill-appearing or  toxic-appearing.  HENT:     Head: Normocephalic.     Comments: Tender to palpation of maxillary sinuses.     Right Ear: Tympanic membrane, ear canal and external ear normal.     Left Ear: No drainage or swelling. No mastoid tenderness. Tympanic membrane is erythematous and bulging.     Nose: Nose normal.     Mouth/Throat:     Comments: No erythema to oropharynx, no edema, no exudate, no tonsillar swelling, voice normal, neck supple without lymphadenopathy. Eyes:     General: No scleral icterus.       Right eye: No discharge.        Left eye: No discharge.     Conjunctiva/sclera: Conjunctivae normal.  Neck:     Musculoskeletal: Normal range of motion and neck supple. No muscular tenderness.     Vascular: No JVD.  Cardiovascular:     Rate and Rhythm: Normal rate and regular rhythm.     Pulses: Normal pulses.     Heart sounds: Normal heart sounds.  Pulmonary:     Effort: Pulmonary effort is normal.     Breath sounds: Normal breath sounds.  Abdominal:     General: There is no distension.  Musculoskeletal: Normal range of motion.  Lymphadenopathy:     Cervical: No cervical adenopathy.  Skin:    General: Skin is warm and dry.  Neurological:     Mental Status: She is oriented to person, place, and time.     GCS: GCS eye subscore is 4. GCS verbal subscore is 5. GCS motor subscore is 6.     Comments: Fluent speech, no facial droop.  Psychiatric:        Behavior: Behavior normal.      ED Treatments / Results  Labs (all labs ordered are listed, but only abnormal results are displayed) Labs Reviewed - No data to display  EKG None  Radiology No results found.  Procedures Procedures (including critical care time)  Medications Ordered in ED Medications - No data to display   Initial Impression / Assessment and Plan / ED Course  I have reviewed the triage vital signs and the nursing notes.  Pertinent labs & imaging results that were available during my care of the  patient were reviewed by me and considered in my medical decision making (see chart for details).  53 yo female with sinus pain and otalgia x 3 days. She is afebrile, well appearing. Patient presents with otalgia and exam consistent with acute otitis media. No concern for acute mastoiditis, meningitis. She also has maxillary sinus tenderness on exam. Lungs clear to auscultation in all fields. No antibiotic use in the last month however will discharge home with Augmentin to cover for possible sinus infection and OM. Instructions given for flonase and symptomatic treatment.  Advised pt to follow up with pcp.  I have also discussed reasons to return immediately to the ER.  Pt expresses understanding and agrees with plan.  This note was prepared using Dragon voice recognition software and may include unintentional dictation errors due to the inherent limitations of voice recognition software.   Final Clinical Impressions(s) / ED Diagnoses   Final diagnoses:  Acute maxillary sinusitis, recurrence not specified    ED Discharge Orders         Ordered    amoxicillin-clavulanate (AUGMENTIN) 875-125 MG tablet  Every 12 hours     08/19/18 1458           , Caroleen Hamman, PA-C 08/19/18 1509    Gerhard Munch, MD 08/19/18 1626

## 2018-08-19 NOTE — ED Triage Notes (Signed)
Sinus pain x 3 days.  

## 2018-08-19 NOTE — Discharge Instructions (Signed)
You have been seen today for sinus pain. Please read and follow all provided instructions. Return to the emergency room for worsening condition or new concerning symptoms.    1. Medications:  Prescription to your pharmacy for Augmentin.  This is an antibiotic.  Please take as prescribed. It can treat your ear infection and sinus infection. -Also recommend you use Flonase daily.  This is an over-the-counter medicine. -You can take Zyrtec daily also this helps with allergies. Continue usual home medications Take medications as prescribed. Please review all of the medicines and only take them if you do not have an allergy to them.   2. Treatment: rest, drink plenty of fluids 3. Follow Up: Please follow up with your primary doctor in 2-5 days for discussion of your diagnoses and further evaluation after today's visit; Call today to arrange your follow up.  If you do not have a primary care doctor use the resource guide provided to find one;   It is also a possibility that you have an allergic reaction to any of the medicines that you have been prescribed - Everybody reacts differently to medications and while MOST people have no trouble with most medicines, you may have a reaction such as nausea, vomiting, rash, swelling, shortness of breath. If this is the case, please stop taking the medicine immediately and contact your physician.  ?

## 2019-06-03 ENCOUNTER — Other Ambulatory Visit: Payer: Self-pay | Admitting: Family Medicine

## 2019-06-03 ENCOUNTER — Other Ambulatory Visit: Payer: Self-pay

## 2019-06-03 ENCOUNTER — Ambulatory Visit
Admission: RE | Admit: 2019-06-03 | Discharge: 2019-06-03 | Disposition: A | Discharge status: Home / Self Care | Payer: PRIVATE HEALTH INSURANCE | Source: Ambulatory Visit | Attending: Family Medicine | Admitting: Family Medicine

## 2019-06-03 DIAGNOSIS — M25562 Pain in left knee: Secondary | ICD-10-CM

## 2019-06-03 DIAGNOSIS — G8929 Other chronic pain: Secondary | ICD-10-CM

## 2020-05-31 ENCOUNTER — Emergency Department (HOSPITAL_COMMUNITY)
Admission: EM | Admit: 2020-05-31 | Discharge: 2020-05-31 | Disposition: A | Discharge status: Home / Self Care | Payer: No Typology Code available for payment source | Attending: Emergency Medicine | Admitting: Emergency Medicine

## 2020-05-31 ENCOUNTER — Emergency Department (HOSPITAL_COMMUNITY): Payer: No Typology Code available for payment source

## 2020-05-31 ENCOUNTER — Other Ambulatory Visit: Payer: Self-pay

## 2020-05-31 DIAGNOSIS — M25571 Pain in right ankle and joints of right foot: Secondary | ICD-10-CM | POA: Insufficient documentation

## 2020-05-31 DIAGNOSIS — K429 Umbilical hernia without obstruction or gangrene: Secondary | ICD-10-CM | POA: Diagnosis not present

## 2020-05-31 DIAGNOSIS — R109 Unspecified abdominal pain: Secondary | ICD-10-CM | POA: Diagnosis present

## 2020-05-31 DIAGNOSIS — Y92410 Unspecified street and highway as the place of occurrence of the external cause: Secondary | ICD-10-CM | POA: Diagnosis not present

## 2020-05-31 DIAGNOSIS — M542 Cervicalgia: Secondary | ICD-10-CM | POA: Insufficient documentation

## 2020-05-31 DIAGNOSIS — M25561 Pain in right knee: Secondary | ICD-10-CM | POA: Diagnosis not present

## 2020-05-31 LAB — CBC WITH DIFFERENTIAL/PLATELET
Abs Immature Granulocytes: 0.01 10*3/uL (ref 0.00–0.07)
Basophils Absolute: 0.1 10*3/uL (ref 0.0–0.1)
Basophils Relative: 1 %
Eosinophils Absolute: 0.3 10*3/uL (ref 0.0–0.5)
Eosinophils Relative: 5 %
HCT: 40.2 % (ref 36.0–46.0)
Hemoglobin: 12.3 g/dL (ref 12.0–15.0)
Immature Granulocytes: 0 %
Lymphocytes Relative: 45 %
Lymphs Abs: 2.9 10*3/uL (ref 0.7–4.0)
MCH: 26.4 pg (ref 26.0–34.0)
MCHC: 30.6 g/dL (ref 30.0–36.0)
MCV: 86.3 fL (ref 80.0–100.0)
Monocytes Absolute: 0.7 10*3/uL (ref 0.1–1.0)
Monocytes Relative: 10 %
Neutro Abs: 2.5 10*3/uL (ref 1.7–7.7)
Neutrophils Relative %: 39 %
Platelets: 324 10*3/uL (ref 150–400)
RBC: 4.66 MIL/uL (ref 3.87–5.11)
RDW: 13.7 % (ref 11.5–15.5)
WBC: 6.5 10*3/uL (ref 4.0–10.5)
nRBC: 0 % (ref 0.0–0.2)

## 2020-05-31 LAB — COMPREHENSIVE METABOLIC PANEL
ALT: 19 U/L (ref 0–44)
AST: 38 U/L (ref 15–41)
Albumin: 3.7 g/dL (ref 3.5–5.0)
Alkaline Phosphatase: 97 U/L (ref 38–126)
Anion gap: 10 (ref 5–15)
BUN: 10 mg/dL (ref 6–20)
CO2: 23 mmol/L (ref 22–32)
Calcium: 9 mg/dL (ref 8.9–10.3)
Chloride: 101 mmol/L (ref 98–111)
Creatinine, Ser: 0.65 mg/dL (ref 0.44–1.00)
GFR, Estimated: >60 mL/min (ref >60)
Glucose, Bld: 109 mg/dL — ABNORMAL HIGH (ref 70–99)
Potassium: 4.9 mmol/L (ref 3.5–5.1)
Sodium: 134 mmol/L — ABNORMAL LOW (ref 135–145)
Total Bilirubin: 1.7 mg/dL — ABNORMAL HIGH (ref 0.3–1.2)
Total Protein: 7.5 g/dL (ref 6.5–8.1)

## 2020-05-31 MED ORDER — ACETAMINOPHEN 325 MG PO TABS
650.0000 mg | ORAL_TABLET | Freq: Once | ORAL | Status: AC
Start: 2020-05-31 — End: 2020-05-31
  Administered 2020-05-31: 650 mg via ORAL
  Filled 2020-05-31: qty 2

## 2020-05-31 MED ORDER — METHOCARBAMOL 500 MG PO TABS: 500.0000 mg | ORAL_TABLET | Freq: Two times a day (BID) | ORAL | 0 refills | Status: AC

## 2020-05-31 MED ORDER — METHOCARBAMOL 500 MG PO TABS: 500.0000 mg | ORAL_TABLET | Freq: Two times a day (BID) | ORAL | 0 refills | Status: DC

## 2020-05-31 MED ORDER — IOHEXOL 300 MG/ML  SOLN
100.0000 mL | Freq: Once | INTRAMUSCULAR | Status: AC | PRN
  Administered 2020-05-31: 100 mL via INTRAVENOUS
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
## 2020-05-31 NOTE — ED Notes (Signed)
Patient transported to X-ray 
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
## 2020-05-31 NOTE — Progress Notes (Signed)
Orthopedic Tech Progress Note Patient Details:  Tina Acosta 1966/02/22 [id number] Pt asked to take too different sized ASOs home with her, I informed her that she would be charged for both ASOs and she agreed.  Ortho Devices Type of Ortho Device: ASO Ortho Device/Splint Location: Right Upper Extremity Ortho Device/Splint Interventions: Ordered,Application,Adjustment   Post Interventions Patient Tolerated: Well Instructions Provided: Adjustment of device,Care of device,Poper ambulation with device   [name] P Harle Stanford 05/31/2020, 10:08 PM

## 2021-06-01 ENCOUNTER — Ambulatory Visit: Payer: Self-pay

## 2021-06-08 ENCOUNTER — Ambulatory Visit: Payer: Self-pay

## 2021-06-15 ENCOUNTER — Ambulatory Visit: Payer: Self-pay
